# Patient Record
Sex: Female | Born: 1970 | Race: White | Hispanic: No | Marital: Married | Smoking: Never smoker
Health system: Southern US, Community
[De-identification: ages and names within clinical notes are randomized; demographics above are authoritative.]

## PROBLEM LIST (undated history)

## (undated) DIAGNOSIS — R011 Cardiac murmur, unspecified: Secondary | ICD-10-CM

## (undated) DIAGNOSIS — T7840XA Allergy, unspecified, initial encounter: Secondary | ICD-10-CM

## (undated) DIAGNOSIS — Z5189 Encounter for other specified aftercare: Secondary | ICD-10-CM

## (undated) HISTORY — DX: Encounter for other specified aftercare: Z51.89

## (undated) HISTORY — DX: Cardiac murmur, unspecified: R01.1

## (undated) HISTORY — DX: Allergy, unspecified, initial encounter: T78.40XA

---
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
## 1998-01-02 ENCOUNTER — Other Ambulatory Visit: Admission: RE | Admit: 1998-01-02 | Discharge: 1998-01-02 | Payer: Self-pay | Admitting: Obstetrics & Gynecology

## 1999-01-13 ENCOUNTER — Other Ambulatory Visit: Admission: RE | Admit: 1999-01-13 | Discharge: 1999-01-13 | Payer: Self-pay | Admitting: Obstetrics & Gynecology

## 2000-02-09 ENCOUNTER — Other Ambulatory Visit: Admission: RE | Admit: 2000-02-09 | Discharge: 2000-02-09 | Payer: Self-pay | Admitting: Obstetrics & Gynecology

## 2001-03-23 ENCOUNTER — Other Ambulatory Visit: Admission: RE | Admit: 2001-03-23 | Discharge: 2001-03-23 | Payer: Self-pay | Admitting: Obstetrics & Gynecology

## 2003-05-23 ENCOUNTER — Other Ambulatory Visit: Admission: RE | Admit: 2003-05-23 | Discharge: 2003-05-23 | Payer: Self-pay | Admitting: Obstetrics & Gynecology

## 2004-07-01 ENCOUNTER — Other Ambulatory Visit: Admission: RE | Admit: 2004-07-01 | Discharge: 2004-07-01 | Payer: Self-pay | Admitting: Obstetrics & Gynecology

## 2005-07-15 ENCOUNTER — Other Ambulatory Visit: Admission: RE | Admit: 2005-07-15 | Discharge: 2005-07-15 | Payer: Self-pay | Admitting: Obstetrics & Gynecology

## 2007-09-12 ENCOUNTER — Encounter: Admission: RE | Admit: 2007-09-12 | Discharge: 2007-09-12 | Payer: Self-pay | Admitting: Obstetrics & Gynecology

## 2011-03-30 ENCOUNTER — Ambulatory Visit: Payer: Self-pay | Admitting: Internal Medicine

## 2011-04-07 ENCOUNTER — Ambulatory Visit (INDEPENDENT_AMBULATORY_CARE_PROVIDER_SITE_OTHER): Payer: 59 | Admitting: Internal Medicine

## 2011-04-07 ENCOUNTER — Encounter: Payer: Self-pay | Admitting: Internal Medicine

## 2011-04-07 ENCOUNTER — Other Ambulatory Visit (INDEPENDENT_AMBULATORY_CARE_PROVIDER_SITE_OTHER): Payer: 59

## 2011-04-07 VITALS — BP 112/82 | HR 78 | Temp 98.5°F | Ht 66.0 in | Wt 153.8 lb

## 2011-04-07 DIAGNOSIS — Z0000 Encounter for general adult medical examination without abnormal findings: Secondary | ICD-10-CM

## 2011-04-07 DIAGNOSIS — Z23 Encounter for immunization: Secondary | ICD-10-CM

## 2011-04-07 LAB — URINALYSIS
Bilirubin Urine: NEGATIVE
Hgb urine dipstick: NEGATIVE
Ketones, ur: NEGATIVE
Leukocytes, UA: NEGATIVE
Total Protein, Urine: NEGATIVE
pH: 7.5 (ref 5.0–8.0)

## 2011-04-07 LAB — CBC WITH DIFFERENTIAL/PLATELET
Basophils Absolute: 0 10*3/uL (ref 0.0–0.1)
HCT: 40.2 % (ref 36.0–46.0)
Hemoglobin: 13.5 g/dL (ref 12.0–15.0)
Lymphs Abs: 1.1 10*3/uL (ref 0.7–4.0)
MCV: 91.8 fl (ref 78.0–100.0)
Monocytes Absolute: 0.3 10*3/uL (ref 0.1–1.0)
Monocytes Relative: 7.2 % (ref 3.0–12.0)
Neutro Abs: 2.9 10*3/uL (ref 1.4–7.7)
Platelets: 137 10*3/uL — ABNORMAL LOW (ref 150.0–400.0)
RDW: 12.9 % (ref 11.5–14.6)

## 2011-04-07 LAB — TSH: TSH: 1.84 u[IU]/mL (ref 0.35–5.50)

## 2011-04-07 LAB — LIPID PANEL
HDL: 56.6 mg/dL (ref >39.00)
LDL Cholesterol: 100 mg/dL — ABNORMAL HIGH (ref 0–99)
Total CHOL/HDL Ratio: 3
VLDL: 10.2 mg/dL (ref 0.0–40.0)

## 2011-04-07 LAB — BASIC METABOLIC PANEL
BUN: 10 mg/dL (ref 6–23)
CO2: 29 mEq/L (ref 19–32)
Chloride: 105 mEq/L (ref 96–112)
GFR: 95.78 mL/min (ref >60.00)
Glucose, Bld: 93 mg/dL (ref 70–99)
Potassium: 3.9 mEq/L (ref 3.5–5.1)
Sodium: 140 mEq/L (ref 135–145)

## 2011-04-07 LAB — HEPATIC FUNCTION PANEL
Bilirubin, Direct: 0.1 mg/dL (ref 0.0–0.3)
Total Bilirubin: 1 mg/dL (ref 0.3–1.2)

## 2011-04-07 NOTE — Patient Instructions (Signed)
It was good to see you today. We have reviewed your prior medical history today Exam looks great - no heart murmur, no other problems detected Test(s) ordered today. Your results will be called to you after review (48-72hours after test completion). If any changes need to be made, you will be notified at that time. Tdap booster given today Continue to follow with Dr. Jennette Kettle as ongoing Please schedule followup in 12 months for annual physical and labs, call sooner if problems.

## 2011-04-07 NOTE — Progress Notes (Signed)
  Subjective:    Patient ID: Linda Rodgers, female    DOB: 06-09-1971, 40 y.o.   MRN: [id number]  HPI New pt to me and our practice, here to establish care Also, patient is here today for annual physical. Patient feels well and has no complaints.  Past Medical History  Diagnosis Date  . Heart murmur     pregnancy   Family History  Problem Relation Age of Onset  . Breast cancer Other   . Hypertension Mother   . Diabetes Father   . Parkinsonism Father   . Heart disease Father 35    MI x 2   History  Substance Use Topics  . Smoking status: Never Smoker   . Smokeless tobacco: Not on file  . Alcohol Use: Yes    Review of Systems Constitutional: Negative for fever.  Respiratory: Negative for cough and shortness of breath.   Cardiovascular: Negative for chest pain.  Gastrointestinal: Negative for abdominal pain.  Musculoskeletal: Negative for gait problem.  Skin: Negative for rash.  Neurological: Negative for dizziness.  No other specific complaints in a complete review of systems    Objective:   Physical Exam BP 112/82  Pulse 78  Temp(Src) 98.5 F (36.9 C) (Oral)  Ht 5\' 6"  (1.676 m)  Wt 153 lb 12.8 oz (69.763 kg)  BMI 24.82 kg/m2  SpO2 98%  Constitutional: She is fit appearing; oriented to person, place, and time. She appears well-developed and well-nourished. No distress.  HENT: Head: Normocephalic and atraumatic. Ears; B TMs ok, no erythema or effusion; Nose: Nose normal.  Mouth/Throat: Oropharynx is clear and moist. No oropharyngeal exudate.  Eyes: Conjunctivae and EOM are normal. Pupils are equal, round, and reactive to light. No scleral icterus.  Neck: Normal range of motion. Neck supple. No JVD present. No thyromegaly present.  Cardiovascular: Normal rate, regular rhythm and normal heart sounds.  No murmur heard. No BLE edema. Pulmonary/Chest: Effort normal and breath sounds normal. No respiratory distress. She has no wheezes.  Abdominal: Soft. Bowel sounds  are normal. She exhibits no distension. There is no tenderness.  Musculoskeletal: Normal range of motion, no joint effusions. No gross deformities Neurological: She is alert and oriented to person, place, and time. No cranial nerve deficit. Coordination normal.  Skin: Skin is warm and dry. No rash noted. No erythema.  Psychiatric: She has a normal mood and affect. Her behavior is normal. Judgment and thought content normal.   No results found for this basename: WBC, HGB, HCT, PLT, CHOL, TRIG, HDL, LDLDIRECT, ALT, AST, NA, K, CL, CREATININE, BUN, CO2, TSH, PSA, INR, GLUF, HGBA1C, MICROALBUR       Assessment & Plan:   CPX - v70.0 - Patient has been counseled on age-appropriate routine health concerns for screening and prevention. These are reviewed and up-to-date. Immunizations are up-to-date or declined. Labs ordered and will be reviewed.

## 2011-07-29 ENCOUNTER — Encounter: Payer: Self-pay | Admitting: Internal Medicine

## 2011-07-29 ENCOUNTER — Other Ambulatory Visit: Payer: 59

## 2011-07-29 ENCOUNTER — Ambulatory Visit (INDEPENDENT_AMBULATORY_CARE_PROVIDER_SITE_OTHER): Payer: 59 | Admitting: Internal Medicine

## 2011-07-29 VITALS — BP 102/72 | HR 76 | Temp 98.3°F | Wt 152.8 lb

## 2011-07-29 DIAGNOSIS — Z111 Encounter for screening for respiratory tuberculosis: Secondary | ICD-10-CM

## 2011-07-29 DIAGNOSIS — Z1159 Encounter for screening for other viral diseases: Secondary | ICD-10-CM

## 2011-07-29 DIAGNOSIS — Z0289 Encounter for other administrative examinations: Secondary | ICD-10-CM

## 2011-07-29 DIAGNOSIS — Z23 Encounter for immunization: Secondary | ICD-10-CM

## 2011-07-29 LAB — RUBELLA SCREEN: Rubella: 75.9 IU/mL — ABNORMAL HIGH

## 2011-07-29 NOTE — Patient Instructions (Signed)
It was good to see you today. You received your flu shot today and begun the series of injections for hepatitis B  - 1st of 3 done today Blood drawn today to prove immunity against measles mumps rubella (MMR) PPD placed today for tuberculosis screening.  You'll need to return on Friday to this office for nurse check on this PPD ; we will then return of your paperwork to you for your employment

## 2011-07-29 NOTE — Progress Notes (Signed)
  Subjective:    Patient ID: Linda Rodgers, female    DOB: August 18, 1971, 40 y.o.   MRN: [id number]  HPI  Here for paperwork completion> [facility] [employer] public school form verifying TB check and immunization review -patient feels well today  Past Medical History  Diagnosis Date  . Heart murmur     pregnancy     Review of Systems  Constitutional: Negative for fever and unexpected weight change.  Respiratory: Negative for cough and shortness of breath.   Cardiovascular: Negative for chest pain and leg swelling.  Neurological: Negative for dizziness and headaches.       Objective:   Physical Exam BP 102/72  Pulse 76  Temp(Src) 98.3 F (36.8 C) (Oral)  Wt 152 lb 12.8 oz (69.31 kg)  SpO2 99% Wt Readings from Last 3 Encounters:  07/29/11 152 lb 12.8 oz (69.31 kg)  04/07/11 153 lb 12.8 oz (69.763 kg)   Constitutional: She appears well-developed and well-nourished. No distress.  Neck: Normal range of motion. Neck supple. No JVD present. No thyromegaly present.  Cardiovascular: Normal rate, regular rhythm and normal heart sounds.  No murmur heard. No BLE edema. Pulmonary/Chest: Effort normal and breath sounds normal. No respiratory distress. She has no wheezes.    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

## 2011-07-31 LAB — TB SKIN TEST: TB Skin Test: NEGATIVE mm

## 2011-08-28 ENCOUNTER — Ambulatory Visit (INDEPENDENT_AMBULATORY_CARE_PROVIDER_SITE_OTHER): Payer: BC Managed Care – PPO | Admitting: *Deleted

## 2011-08-28 ENCOUNTER — Ambulatory Visit: Payer: BC Managed Care – PPO | Admitting: Internal Medicine

## 2011-08-28 DIAGNOSIS — Z23 Encounter for immunization: Secondary | ICD-10-CM

## 2011-09-07 LAB — HM MAMMOGRAPHY: HM Mammogram: NEGATIVE

## 2011-09-18 ENCOUNTER — Telehealth: Payer: Self-pay | Admitting: Internal Medicine

## 2011-09-18 NOTE — Telephone Encounter (Signed)
Received copies from [facility] ,on 1.11.13 . Forwarded 1  pages to Dr. Felicity Coyer ,for review.  sj

## 2011-09-21 ENCOUNTER — Encounter: Payer: Self-pay | Admitting: Internal Medicine

## 2012-01-26 ENCOUNTER — Ambulatory Visit (INDEPENDENT_AMBULATORY_CARE_PROVIDER_SITE_OTHER): Payer: BC Managed Care – PPO | Admitting: *Deleted

## 2012-01-26 DIAGNOSIS — Z23 Encounter for immunization: Secondary | ICD-10-CM

## 2012-09-19 ENCOUNTER — Encounter: Payer: BC Managed Care – PPO | Admitting: Internal Medicine

## 2012-10-17 ENCOUNTER — Ambulatory Visit (INDEPENDENT_AMBULATORY_CARE_PROVIDER_SITE_OTHER): Payer: BC Managed Care – PPO | Admitting: Internal Medicine

## 2012-10-17 ENCOUNTER — Other Ambulatory Visit (INDEPENDENT_AMBULATORY_CARE_PROVIDER_SITE_OTHER): Payer: BC Managed Care – PPO

## 2012-10-17 ENCOUNTER — Encounter: Payer: Self-pay | Admitting: Internal Medicine

## 2012-10-17 VITALS — BP 100/80 | HR 83 | Temp 98.0°F | Ht 66.0 in | Wt 150.1 lb

## 2012-10-17 DIAGNOSIS — Z0000 Encounter for general adult medical examination without abnormal findings: Secondary | ICD-10-CM

## 2012-10-17 LAB — BASIC METABOLIC PANEL
BUN: 9 mg/dL (ref 6–23)
Calcium: 9.5 mg/dL (ref 8.4–10.5)
Creatinine, Ser: 0.7 mg/dL (ref 0.4–1.2)
GFR: 96.58 mL/min (ref >60.00)
Glucose, Bld: 95 mg/dL (ref 70–99)

## 2012-10-17 LAB — HM MAMMOGRAPHY

## 2012-10-17 LAB — LIPID PANEL
Cholesterol: 166 mg/dL (ref 0–200)
HDL: 54.7 mg/dL (ref >39.00)
LDL Cholesterol: 101 mg/dL — ABNORMAL HIGH (ref 0–99)
Triglycerides: 50 mg/dL (ref 0.0–149.0)
VLDL: 10 mg/dL (ref 0.0–40.0)

## 2012-10-17 LAB — TSH: TSH: 2.43 u[IU]/mL (ref 0.35–5.50)

## 2012-10-17 LAB — URINALYSIS, ROUTINE W REFLEX MICROSCOPIC
Bilirubin Urine: NEGATIVE
Leukocytes, UA: NEGATIVE
Nitrite: NEGATIVE
pH: 6.5 (ref 5.0–8.0)

## 2012-10-17 LAB — CBC WITH DIFFERENTIAL/PLATELET
Basophils Absolute: 0 10*3/uL (ref 0.0–0.1)
HCT: 42.7 % (ref 36.0–46.0)
Lymphocytes Relative: 20.5 % (ref 12.0–46.0)
Lymphs Abs: 1.1 10*3/uL (ref 0.7–4.0)
Monocytes Relative: 7.5 % (ref 3.0–12.0)
Neutrophils Relative %: 70.8 % (ref 43.0–77.0)
Platelets: 153 10*3/uL (ref 150.0–400.0)
RDW: 12.5 % (ref 11.5–14.6)

## 2012-10-17 LAB — HEPATIC FUNCTION PANEL
Albumin: 4.4 g/dL (ref 3.5–5.2)
Total Bilirubin: 0.9 mg/dL (ref 0.3–1.2)

## 2012-10-17 NOTE — Patient Instructions (Signed)
It was good to see you today. Health Maintenance reviewed - all recommended immunizations and age-appropriate screenings are up-to-date. Test(s) ordered today. Your results will be released to MyChart (or called to you) after review, usually within 72hours after test completion. If any changes need to be made, you will be notified at that same time. Please schedule followup in 1-2 year for medical physical and labs, call sooner if problems.  Health Maintenance, Females A healthy lifestyle and preventative care can promote health and wellness.  Maintain regular health, dental, and eye exams.  Eat a healthy diet. Foods like vegetables, fruits, whole grains, low-fat dairy products, and lean protein foods contain the nutrients you need without too many calories. Decrease your intake of foods high in solid fats, added sugars, and salt. Get information about a proper diet from your caregiver, if necessary.  Regular physical exercise is one of the most important things you can do for your health. Most adults should get at least 150 minutes of moderate-intensity exercise (any activity that increases your heart rate and causes you to sweat) each week. In addition, most adults need muscle-strengthening exercises on 2 or more days a week.   Maintain a healthy weight. The body mass index (BMI) is a screening tool to identify possible weight problems. It provides an estimate of body fat based on height and weight. Your caregiver can help determine your BMI, and can help you achieve or maintain a healthy weight. For adults 20 years and older:  A BMI below 18.5 is considered underweight.  A BMI of 18.5 to 24.9 is normal.  A BMI of 25 to 29.9 is considered overweight.  A BMI of 30 and above is considered obese.  Maintain normal blood lipids and cholesterol by exercising and minimizing your intake of saturated fat. Eat a balanced diet with plenty of fruits and vegetables. Blood tests for lipids and  cholesterol should begin at age 37 and be repeated every 5 years. If your lipid or cholesterol levels are high, you are over 50, or you are a high risk for heart disease, you may need your cholesterol levels checked more frequently.Ongoing high lipid and cholesterol levels should be treated with medicines if diet and exercise are not effective.  If you smoke, find out from your caregiver how to quit. If you do not use tobacco, do not start.  If you are pregnant, do not drink alcohol. If you are breastfeeding, be very cautious about drinking alcohol. If you are not pregnant and choose to drink alcohol, do not exceed 1 drink per day. One drink is considered to be 12 ounces (355 mL) of beer, 5 ounces (148 mL) of wine, or 1.5 ounces (44 mL) of liquor.  Avoid use of street drugs. Do not share needles with anyone. Ask for help if you need support or instructions about stopping the use of drugs.  High blood pressure causes heart disease and increases the risk of stroke. Blood pressure should be checked at least every 1 to 2 years. Ongoing high blood pressure should be treated with medicines, if weight loss and exercise are not effective.  If you are 21 to 41 years old, ask your caregiver if you should take aspirin to prevent strokes.  Diabetes screening involves taking a blood sample to check your fasting blood sugar level. This should be done once every 3 years, after age 4, if you are within normal weight and without risk factors for diabetes. Testing should be considered at a younger  age or be carried out more frequently if you are overweight and have at least 1 risk factor for diabetes.  Breast cancer screening is essential preventative care for women. You should practice "breast self-awareness." This means understanding the normal appearance and feel of your breasts and may include breast self-examination. Any changes detected, no matter how small, should be reported to a caregiver. Women in their 44s  and 30s should have a clinical breast exam (CBE) by a caregiver as part of a regular health exam every 1 to 3 years. After age 80, women should have a CBE every year. Starting at age 46, women should consider having a mammogram (breast X-ray) every year. Women who have a family history of breast cancer should talk to their caregiver about genetic screening. Women at a high risk of breast cancer should talk to their caregiver about having an MRI and a mammogram every year.  The Pap test is a screening test for cervical cancer. Women should have a Pap test starting at age 29. Between ages 2 and 79, Pap tests should be repeated every 2 years. Beginning at age 81, you should have a Pap test every 3 years as long as the past 3 Pap tests have been normal. If you had a hysterectomy for a problem that was not cancer or a condition that could lead to cancer, then you no longer need Pap tests. If you are between ages 11 and 26, and you have had normal Pap tests going back 10 years, you no longer need Pap tests. If you have had past treatment for cervical cancer or a condition that could lead to cancer, you need Pap tests and screening for cancer for at least 20 years after your treatment. If Pap tests have been discontinued, risk factors (such as a new sexual partner) need to be reassessed to determine if screening should be resumed. Some women have medical problems that increase the chance of getting cervical cancer. In these cases, your caregiver may recommend more frequent screening and Pap tests.  The human papillomavirus (HPV) test is an additional test that may be used for cervical cancer screening. The HPV test looks for the virus that can cause the cell changes on the cervix. The cells collected during the Pap test can be tested for HPV. The HPV test could be used to screen women aged 68 years and older, and should be used in women of any age who have unclear Pap test results. After the age of 50, women should  have HPV testing at the same frequency as a Pap test.  Colorectal cancer can be detected and often prevented. Most routine colorectal cancer screening begins at the age of 81 and continues through age 67. However, your caregiver may recommend screening at an earlier age if you have risk factors for colon cancer. On a yearly basis, your caregiver may provide home test kits to check for hidden blood in the stool. Use of a small camera at the end of a tube, to directly examine the colon (sigmoidoscopy or colonoscopy), can detect the earliest forms of colorectal cancer. Talk to your caregiver about this at age 69, when routine screening begins. Direct examination of the colon should be repeated every 5 to 10 years through age 68, unless early forms of pre-cancerous polyps or small growths are found.  Hepatitis C blood testing is recommended for all people born from [birth year] through 1964 and any individual with known risks for hepatitis C.  Practice safe  sex. Use condoms and avoid high-risk sexual practices to reduce the spread of sexually transmitted infections (STIs). Sexually active women aged 18 and younger should be checked for Chlamydia, which is a common sexually transmitted infection. Older women with new or multiple partners should also be tested for Chlamydia. Testing for other STIs is recommended if you are sexually active and at increased risk.  Osteoporosis is a disease in which the bones lose minerals and strength with aging. This can result in serious bone fractures. The risk of osteoporosis can be identified using a bone density scan. Women ages 80 and over and women at risk for fractures or osteoporosis should discuss screening with their caregivers. Ask your caregiver whether you should be taking a calcium supplement or vitamin D to reduce the rate of osteoporosis.  Menopause can be associated with physical symptoms and risks. Hormone replacement therapy is available to decrease symptoms and  risks. You should talk to your caregiver about whether hormone replacement therapy is right for you.  Use sunscreen with a sun protection factor (SPF) of 30 or greater. Apply sunscreen liberally and repeatedly throughout the day. You should seek shade when your shadow is shorter than you. Protect yourself by wearing long sleeves, pants, a wide-brimmed hat, and sunglasses year round, whenever you are outdoors.  Notify your caregiver of new moles or changes in moles, especially if there is a change in shape or color. Also notify your caregiver if a mole is larger than the size of a pencil eraser.  Stay current with your immunizations. Document Released: 03/09/2011 Document Revised: 11/16/2011 Document Reviewed: 03/09/2011 [facility] Patient Information 2013 [place], [place].

## 2012-10-17 NOTE — Progress Notes (Signed)
  Subjective:    Patient ID: Linda Rodgers, female    DOB: 12-19-1970, 42 y.o.   MRN: [id number]  HPI  patient is here today for annual physical. Patient feels well and has no complaints.  Past Medical History  Diagnosis Date  . Heart murmur     pregnancy   Family History  Problem Relation Age of Onset  . Breast cancer Other   . Hypertension Mother   . Diabetes Father   . Parkinsonism Father   . Heart disease Father 65    MI x 2   History  Substance Use Topics  . Smoking status: Never Smoker   . Smokeless tobacco: Not on file  . Alcohol Use: Yes    Review of Systems  Constitutional: Negative for fever.  Respiratory: Negative for cough and shortness of breath.   Cardiovascular: Negative for chest pain or palpitations.  Gastrointestinal: Negative for abdominal pain or bowel changes.  Musculoskeletal: Negative for gait problem or joint swelling.  Skin: Negative for rash.  Neurological: Negative for dizziness.  No other specific complaints in a complete review of systems    Objective:   Physical Exam  BP 100/80  Pulse 83  Temp(Src) 98 F (36.7 C) (Oral)  Ht 5\' 6"  (1.676 m)  Wt 150 lb 1.9 oz (68.094 kg)  BMI 24.24 kg/m2  SpO2 98% Wt Readings from Last 3 Encounters:  10/17/12 150 lb 1.9 oz (68.094 kg)  07/29/11 152 lb 12.8 oz (69.31 kg)  04/07/11 153 lb 12.8 oz (69.763 kg)   Constitutional: She is fit appearing, well-developed and well-nourished. No distress.  HENT: Head: Normocephalic and atraumatic. Ears; B TMs ok, no erythema or effusion; Nose: Nose normal. Mouth/Throat: Oropharynx is clear and moist. No oropharyngeal exudate.  Eyes: Conjunctivae and EOM are normal. Pupils are equal, round, and reactive to light. No scleral icterus.  Neck: Normal range of motion. Neck supple. No JVD present. No thyromegaly present.  Cardiovascular: Normal rate, regular rhythm and normal heart sounds.  No murmur heard. No BLE edema. Pulmonary/Chest: Effort normal and breath  sounds normal. No respiratory distress. She has no wheezes.  Abdominal: Soft. Bowel sounds are normal. She exhibits no distension. There is no tenderness.  Musculoskeletal: Normal range of motion, no joint effusions. No gross deformities Neurological: She is alert and oriented to person, place, and time. No cranial nerve deficit. Coordination normal.  Skin: Skin is warm and dry. No rash noted. No erythema.  Psychiatric: She has a normal mood and affect. Her behavior is normal. Judgment and thought content normal.   Lab Results  Component Value Date   WBC 4.4* 04/07/2011   HGB 13.5 04/07/2011   HCT 40.2 04/07/2011   PLT 137.0* 04/07/2011   GLUCOSE 93 04/07/2011   CHOL 167 04/07/2011   TRIG 51.0 04/07/2011   HDL 56.60 04/07/2011   LDLCALC 100* 04/07/2011   ALT 17 04/07/2011   AST 13 04/07/2011   NA 140 04/07/2011   K 3.9 04/07/2011   CL 105 04/07/2011   CREATININE 0.7 04/07/2011   BUN 10 04/07/2011   CO2 29 04/07/2011   TSH 1.84 04/07/2011   ECG: sinus @ 80-bpm; PR .110 - no prior ECG    Assessment & Plan:   CPX - v70.0 - Patient has been counseled on age-appropriate routine health concerns for screening and prevention. These are reviewed and up-to-date. Immunizations are up-to-date or declined. ECG reviewed; Labs ordered and will be reviewed.

## 2013-03-07 LAB — HM PAP SMEAR

## 2013-07-13 ENCOUNTER — Other Ambulatory Visit: Payer: Self-pay

## 2013-10-18 ENCOUNTER — Encounter: Payer: BC Managed Care – PPO | Admitting: Internal Medicine

## 2013-10-19 ENCOUNTER — Encounter: Payer: Self-pay | Admitting: Internal Medicine

## 2013-10-19 ENCOUNTER — Other Ambulatory Visit (INDEPENDENT_AMBULATORY_CARE_PROVIDER_SITE_OTHER): Payer: BC Managed Care – PPO

## 2013-10-19 ENCOUNTER — Ambulatory Visit (INDEPENDENT_AMBULATORY_CARE_PROVIDER_SITE_OTHER): Payer: BC Managed Care – PPO | Admitting: Internal Medicine

## 2013-10-19 VITALS — BP 102/70 | HR 70 | Temp 98.3°F | Ht 66.0 in | Wt 151.1 lb

## 2013-10-19 DIAGNOSIS — Z0000 Encounter for general adult medical examination without abnormal findings: Secondary | ICD-10-CM

## 2013-10-19 LAB — URINALYSIS, ROUTINE W REFLEX MICROSCOPIC
Bilirubin Urine: NEGATIVE
Hgb urine dipstick: NEGATIVE
Ketones, ur: NEGATIVE
Leukocytes, UA: NEGATIVE
NITRITE: NEGATIVE
RBC / HPF: NONE SEEN (ref 0–?)
Specific Gravity, Urine: <=1.005 — AB (ref 1.000–1.030)
TOTAL PROTEIN, URINE-UPE24: NEGATIVE
URINE GLUCOSE: NEGATIVE
Urobilinogen, UA: 0.2 (ref 0.0–1.0)
WBC UA: NONE SEEN (ref 0–?)
pH: 7.5 (ref 5.0–8.0)

## 2013-10-19 LAB — CBC WITH DIFFERENTIAL/PLATELET
BASOS PCT: 0.2 % (ref 0.0–3.0)
Basophils Absolute: 0 10*3/uL (ref 0.0–0.1)
EOS PCT: 1.5 % (ref 0.0–5.0)
Eosinophils Absolute: 0.1 10*3/uL (ref 0.0–0.7)
HEMATOCRIT: 44.8 % (ref 36.0–46.0)
HEMOGLOBIN: 15.1 g/dL — AB (ref 12.0–15.0)
LYMPHS ABS: 1 10*3/uL (ref 0.7–4.0)
Lymphocytes Relative: 24.4 % (ref 12.0–46.0)
MCHC: 33.6 g/dL (ref 30.0–36.0)
MCV: 91.4 fl (ref 78.0–100.0)
MONO ABS: 0.3 10*3/uL (ref 0.1–1.0)
MONOS PCT: 7 % (ref 3.0–12.0)
NEUTROS ABS: 2.7 10*3/uL (ref 1.4–7.7)
Neutrophils Relative %: 66.9 % (ref 43.0–77.0)
PLATELETS: 173 10*3/uL (ref 150.0–400.0)
RBC: 4.91 Mil/uL (ref 3.87–5.11)
RDW: 12.8 % (ref 11.5–14.6)
WBC: 4 10*3/uL — AB (ref 4.5–10.5)

## 2013-10-19 LAB — HEPATIC FUNCTION PANEL
ALT: 22 U/L (ref 0–35)
AST: 15 U/L (ref 0–37)
Albumin: 4.5 g/dL (ref 3.5–5.2)
Alkaline Phosphatase: 49 U/L (ref 39–117)
Bilirubin, Direct: 0.1 mg/dL (ref 0.0–0.3)
Total Bilirubin: 1.2 mg/dL (ref 0.3–1.2)
Total Protein: 7.4 g/dL (ref 6.0–8.3)

## 2013-10-19 LAB — BASIC METABOLIC PANEL
BUN: 7 mg/dL (ref 6–23)
CHLORIDE: 104 meq/L (ref 96–112)
CO2: 31 mEq/L (ref 19–32)
Calcium: 9.7 mg/dL (ref 8.4–10.5)
Creatinine, Ser: 0.8 mg/dL (ref 0.4–1.2)
GFR: 87.51 mL/min (ref >60.00)
Glucose, Bld: 93 mg/dL (ref 70–99)
POTASSIUM: 4.3 meq/L (ref 3.5–5.1)
SODIUM: 140 meq/L (ref 135–145)

## 2013-10-19 LAB — LIPID PANEL
CHOL/HDL RATIO: 3
CHOLESTEROL: 182 mg/dL (ref 0–200)
HDL: 59.8 mg/dL (ref >39.00)
LDL Cholesterol: 115 mg/dL — ABNORMAL HIGH (ref 0–99)
TRIGLYCERIDES: 38 mg/dL (ref 0.0–149.0)
VLDL: 7.6 mg/dL (ref 0.0–40.0)

## 2013-10-19 LAB — TSH: TSH: 1.26 u[IU]/mL (ref 0.35–5.50)

## 2013-10-19 NOTE — Progress Notes (Signed)
Pre-visit discussion using our clinic review tool. No additional management support is needed unless otherwise documented below in the visit note.  

## 2013-10-19 NOTE — Progress Notes (Signed)
    Subjective:    Patient ID: Linda Rodgers, female    DOB: [birth year], 43 y.o.   MRN: [id number]  HPI patient is here today for annual physical. Patient feels well and has no complaints.  Past Medical History  Diagnosis Date  . Heart murmur     pregnancy   Family History  Problem Relation Age of Onset  . Breast cancer Other   . Hypertension Mother   . Diabetes Father   . Parkinsonism Father   . Heart disease Father [id number]    MI x 2   History  Substance Use Topics  . Smoking status: Never Smoker   . Smokeless tobacco: Not on file  . Alcohol Use: Yes    Review of Systems  Constitutional: Negative for fatigue and unexpected weight change.  Respiratory: Negative for cough, shortness of breath and wheezing.   Cardiovascular: Negative for chest pain, palpitations and leg swelling.  Gastrointestinal: Negative for nausea, abdominal pain and diarrhea.  Neurological: Negative for dizziness, weakness, light-headedness and headaches.  Psychiatric/Behavioral: Negative for dysphoric mood. The patient is not nervous/anxious.   All other systems reviewed and are negative.       Objective:   Physical Exam BP 102/70  Pulse 70  Temp(Src) 98.3 F (36.8 C) (Oral)  Ht 5\' 6"  (1.676 m)  Wt 151 lb 1.9 oz (68.548 kg)  BMI 24.40 kg/m2  SpO2 97% Wt Readings from Last 3 Encounters:  10/19/13 151 lb 1.9 oz (68.548 kg)  10/17/12 150 lb 1.9 oz (68.094 kg)  07/29/11 152 lb 12.8 oz (69.31 kg)   Constitutional: She is fit appearing, well-developed and well-nourished. No distress.  HENT: Head: Normocephalic and atraumatic. Ears; B TMs ok, no erythema or effusion; Nose: Nose normal. Mouth/Throat: Oropharynx is clear and moist. No oropharyngeal exudate.  Eyes: Conjunctivae and EOM are normal. Pupils are equal, round, and reactive to light. No scleral icterus.  Neck: Normal range of motion. Neck supple. No JVD present. No thyromegaly present.  Cardiovascular: Normal rate, regular rhythm and  normal heart sounds.  No murmur heard. No BLE edema. Pulmonary/Chest: Effort normal and breath sounds normal. No respiratory distress. She has no wheezes.  Abdominal: Soft. Bowel sounds are normal. She exhibits no distension. There is no tenderness.  Musculoskeletal: Normal range of motion, no joint effusions. No gross deformities Neurological: She is alert and oriented to person, place, and time. No cranial nerve deficit. Coordination normal.  Skin: Skin is warm and dry. No rash noted. No erythema.  Psychiatric: She has a normal mood and affect. Her behavior is normal. Judgment and thought content normal.   Lab Results  Component Value Date   WBC 5.5 10/17/2012   HGB 14.6 10/17/2012   HCT 42.7 10/17/2012   PLT 153.0 10/17/2012   GLUCOSE 95 10/17/2012   CHOL 166 10/17/2012   TRIG 50.0 10/17/2012   HDL 54.70 10/17/2012   LDLCALC 101* 10/17/2012   ALT 16 10/17/2012   AST 15 10/17/2012   NA 139 10/17/2012   K 4.1 10/17/2012   CL 104 10/17/2012   CREATININE 0.7 10/17/2012   BUN 9 10/17/2012   CO2 29 10/17/2012   TSH 2.43 10/17/2012       Assessment & Plan:   CPX /v70.0 - Patient has been counseled on age-appropriate routine health concerns for screening and prevention. These are reviewed and up-to-date. Immunizations are up-to-date or declined. last ECG reviewed; Labs ordered and will be reviewed.

## 2013-10-19 NOTE — Progress Notes (Signed)
Linda Rodgers [id number] 10/19/2013   Chief Complaint  Patient presents with  . Annual Exam    Subjective  HPI Patient is here for her annual physical.  No new complaints.  Past Medical History  Diagnosis Date  . Heart murmur     pregnancy    Past Surgical History  Procedure Laterality Date  . Wisdom tooth extraction  1987    Family History  Problem Relation Age of Onset  . Breast cancer Other   . Hypertension Mother   . Diabetes Father   . Parkinsonism Father   . Heart disease Father 26    MI x 2    History  Substance Use Topics  . Smoking status: Never Smoker   . Smokeless tobacco: Not on file  . Alcohol Use: Yes    No current outpatient prescriptions on file prior to visit.   No current facility-administered medications on file prior to visit.    Allergies:  Allergies  Allergen Reactions  . Neomycin     Per pt neomycin family  . Neosporin [Neomycin-Polymyxin B Gu]     Review of Systems  Constitutional: Negative for fever, chills, weight loss and malaise/fatigue.  HENT: Negative for congestion and nosebleeds.   Eyes: Negative for blurred vision.  Respiratory: Negative for cough, shortness of breath and wheezing.   Cardiovascular: Negative for chest pain.  Gastrointestinal: Negative for heartburn, nausea and vomiting.  Neurological: Negative for speech change and headaches.  Psychiatric/Behavioral: Negative for depression and suicidal ideas. The patient is not nervous/anxious and does not have insomnia.        Objective  Filed Vitals:   10/19/13 0840  BP: 102/70  Pulse: 70  Temp: 98.3 F (36.8 C)  TempSrc: Oral  Height: 5\' 6"  (1.676 m)  Weight: 151 lb 1.9 oz (68.548 kg)  SpO2: 97%    Physical Exam  Nursing note and vitals reviewed. Constitutional: She is oriented to person, place, and time. She appears well-developed and well-nourished. No distress.  HENT:  Head: Normocephalic and atraumatic.  Right Ear: External ear normal.   Left Ear: External ear normal.  Eyes: Conjunctivae are normal. Pupils are equal, round, and reactive to light. Right eye exhibits no discharge. Left eye exhibits no discharge.  Neck: No JVD present.  Cardiovascular: Normal rate, regular rhythm, normal heart sounds and intact distal pulses.  Exam reveals no gallop and no friction rub.   No murmur heard. Respiratory: Effort normal and breath sounds normal. No stridor. No respiratory distress. She has no wheezes. She has no rales. She exhibits no tenderness.  GI: Soft. Bowel sounds are normal. There is no tenderness.  Musculoskeletal: Normal range of motion. She exhibits no tenderness.  Neurological: She is alert and oriented to person, place, and time.  Skin: She is not diaphoretic.  Psychiatric: She has a normal mood and affect. Her speech is normal and behavior is normal. Judgment and thought content normal. Cognition and memory are normal.    BP Readings from Last 3 Encounters:  10/19/13 102/70  10/17/12 100/80  07/29/11 102/72    Wt Readings from Last 3 Encounters:  10/19/13 151 lb 1.9 oz (68.548 kg)  10/17/12 150 lb 1.9 oz (68.094 kg)  07/29/11 152 lb 12.8 oz (69.31 kg)    Lab Results  Component Value Date   WBC 5.5 10/17/2012   HGB 14.6 10/17/2012   HCT 42.7 10/17/2012   PLT 153.0 10/17/2012   GLUCOSE 95 10/17/2012   CHOL 166 10/17/2012  TRIG 50.0 10/17/2012   HDL 54.70 10/17/2012   LDLCALC 101* 10/17/2012   ALT 16 10/17/2012   AST 15 10/17/2012   NA 139 10/17/2012   K 4.1 10/17/2012   CL 104 10/17/2012   CREATININE 0.7 10/17/2012   BUN 9 10/17/2012   CO2 29 10/17/2012   TSH 2.43 10/17/2012    Mm Digital Diagnostic Unilat L  09/12/2007   DG DIAGNOSTIC UNILATERAL L CC and MLO view(s) were taken of the left breast.   DIGITAL UNILATERAL LEFT DIAGNOSTIC MAMMOGRAM:   CLINICAL DATA:  The patient returns for evaluation of calcifications in the left breast noted on  recent screening study from [facility] dated 08-19-07.    Magnification views of the left breast demonstrate calcifications that are consistent with benign  milk of calcium.  No suspicious linear forms are noted.   IMPRESSION:   Benign milk of calcium in the left breast.  Yearly screening should begin at age 42 unless  clinically indicated earlier.   ASSESSMENT: Benign - BI-RADS 2   Screening mammogram of both breasts at age 43. ,  Provider: Evaristo BuryStephanie Pugh    Assessment and Plan  V70.0 reviewed past labs. Patient reports a healthy lifestyle with daily exercise and a healthy diet.  Patient does report any depression symptoms.  No new family changes or complaints.  Patient is up to date on vaccines.  She has an appointment Monday with her OBGYN for Mammo and Pap.  Will order baseline labs today, CBC. BMET, LFT, TSH, Urinalysis.   Patient was instructed to notify the office if any new symptoms emerged or the current symptoms become worse.   Return in about 1 year (around 10/19/2014) for annual exam and labs. Rene PaciValerie Leschber, MD    I have personally reviewed this case with PA student. I also personally examined this patient. I agree with history and findings as documented above. I reviewed, discussed and approve of the assessment and plan as listed above. Rene PaciValerie Leschber, MD

## 2013-10-19 NOTE — Patient Instructions (Addendum)
It was good to see you today.  We have reviewed your prior records including labs and tests today  Health Maintenance reviewed - all recommended immunizations and age-appropriate screenings are up-to-date.  Test(s) ordered today. Your results will be released to [facility] (or called to you) after review, usually within 72hours after test completion. If any changes need to be made, you will be notified at that same time.  Medications reviewed and updated, no changes recommended at this time.  Please schedule followup in 12 months for annual exam and labs, call sooner if problems.  Health Maintenance, Female A healthy lifestyle and preventative care can promote health and wellness.  Maintain regular health, dental, and eye exams.  Eat a healthy diet. Foods like vegetables, fruits, whole grains, low-fat dairy products, and lean protein foods contain the nutrients you need without too many calories. Decrease your intake of foods high in solid fats, added sugars, and salt. Get information about a proper diet from your caregiver, if necessary.  Regular physical exercise is one of the most important things you can do for your health. Most adults should get at least 150 minutes of moderate-intensity exercise (any activity that increases your heart rate and causes you to sweat) each week. In addition, most adults need muscle-strengthening exercises on 2 or more days a week.   Maintain a healthy weight. The body mass index (BMI) is a screening tool to identify possible weight problems. It provides an estimate of body fat based on height and weight. Your caregiver can help determine your BMI, and can help you achieve or maintain a healthy weight. For adults 20 years and older:  A BMI below 18.5 is considered underweight.  A BMI of 18.5 to 24.9 is normal.  A BMI of 25 to 29.9 is considered overweight.  A BMI of 30 and above is considered obese.  Maintain normal blood lipids and cholesterol by  exercising and minimizing your intake of saturated fat. Eat a balanced diet with plenty of fruits and vegetables. Blood tests for lipids and cholesterol should begin at age 96 and be repeated every 5 years. If your lipid or cholesterol levels are high, you are over 50, or you are a high risk for heart disease, you may need your cholesterol levels checked more frequently.Ongoing high lipid and cholesterol levels should be treated with medicines if diet and exercise are not effective.  If you smoke, find out from your caregiver how to quit. If you do not use tobacco, do not start.  Lung cancer screening is recommended for adults aged 32 80 years who are at high risk for developing lung cancer because of a history of smoking. Yearly low-dose computed tomography (CT) is recommended for people who have at least a 30-pack-year history of smoking and are a current smoker or have quit within the past 15 years. A pack year of smoking is smoking an average of 1 pack of cigarettes a day for 1 year (for example: 1 pack a day for 30 years or 2 packs a day for 15 years). Yearly screening should continue until the smoker has stopped smoking for at least 15 years. Yearly screening should also be stopped for people who develop a health problem that would prevent them from having lung cancer treatment.  If you are pregnant, do not drink alcohol. If you are breastfeeding, be very cautious about drinking alcohol. If you are not pregnant and choose to drink alcohol, do not exceed 1 drink per day. One drink  is considered to be 12 ounces (355 mL) of beer, 5 ounces (148 mL) of wine, or 1.5 ounces (44 mL) of liquor.  Avoid use of street drugs. Do not share needles with anyone. Ask for help if you need support or instructions about stopping the use of drugs.  High blood pressure causes heart disease and increases the risk of stroke. Blood pressure should be checked at least every 1 to 2 years. Ongoing high blood pressure should be  treated with medicines, if weight loss and exercise are not effective.  If you are 39 to 42 years old, ask your caregiver if you should take aspirin to prevent strokes.  Diabetes screening involves taking a blood sample to check your fasting blood sugar level. This should be done once every 3 years, after age 66, if you are within normal weight and without risk factors for diabetes. Testing should be considered at a younger age or be carried out more frequently if you are overweight and have at least 1 risk factor for diabetes.  Breast cancer screening is essential preventative care for women. You should practice "breast self-awareness." This means understanding the normal appearance and feel of your breasts and may include breast self-examination. Any changes detected, no matter how small, should be reported to a caregiver. Women in their 73s and 30s should have a clinical breast exam (CBE) by a caregiver as part of a regular health exam every 1 to 3 years. After age 39, women should have a CBE every year. Starting at age 91, women should consider having a mammogram (breast X-ray) every year. Women who have a family history of breast cancer should talk to their caregiver about genetic screening. Women at a high risk of breast cancer should talk to their caregiver about having an MRI and a mammogram every year.  Breast cancer gene (BRCA)-related cancer risk assessment is recommended for women who have family members with BRCA-related cancers. BRCA-related cancers include breast, ovarian, tubal, and peritoneal cancers. Having family members with these cancers may be associated with an increased risk for harmful changes (mutations) in the breast cancer genes BRCA1 and BRCA2. Results of the assessment will determine the need for genetic counseling and BRCA1 and BRCA2 testing.  The Pap test is a screening test for cervical cancer. Women should have a Pap test starting at age 63. Between ages 85 and 56, Pap  tests should be repeated every 2 years. Beginning at age 29, you should have a Pap test every 3 years as long as the past 3 Pap tests have been normal. If you had a hysterectomy for a problem that was not cancer or a condition that could lead to cancer, then you no longer need Pap tests. If you are between ages 49 and 47, and you have had normal Pap tests going back 10 years, you no longer need Pap tests. If you have had past treatment for cervical cancer or a condition that could lead to cancer, you need Pap tests and screening for cancer for at least 20 years after your treatment. If Pap tests have been discontinued, risk factors (such as a new sexual partner) need to be reassessed to determine if screening should be resumed. Some women have medical problems that increase the chance of getting cervical cancer. In these cases, your caregiver may recommend more frequent screening and Pap tests.  The human papillomavirus (HPV) test is an additional test that may be used for cervical cancer screening. The HPV test looks for  the virus that can cause the cell changes on the cervix. The cells collected during the Pap test can be tested for HPV. The HPV test could be used to screen women aged 31 years and older, and should be used in women of any age who have unclear Pap test results. After the age of 35, women should have HPV testing at the same frequency as a Pap test.  Colorectal cancer can be detected and often prevented. Most routine colorectal cancer screening begins at the age of 76 and continues through age 69. However, your caregiver may recommend screening at an earlier age if you have risk factors for colon cancer. On a yearly basis, your caregiver may provide home test kits to check for hidden blood in the stool. Use of a small camera at the end of a tube, to directly examine the colon (sigmoidoscopy or colonoscopy), can detect the earliest forms of colorectal cancer. Talk to your caregiver about this at  age 68, when routine screening begins. Direct examination of the colon should be repeated every 5 to 10 years through age 84, unless early forms of pre-cancerous polyps or small growths are found.  Hepatitis C blood testing is recommended for all people born from [birth year] through 1964 and any individual with known risks for hepatitis C.  Practice safe sex. Use condoms and avoid high-risk sexual practices to reduce the spread of sexually transmitted infections (STIs). Sexually active women aged 66 and younger should be checked for Chlamydia, which is a common sexually transmitted infection. Older women with new or multiple partners should also be tested for Chlamydia. Testing for other STIs is recommended if you are sexually active and at increased risk.  Osteoporosis is a disease in which the bones lose minerals and strength with aging. This can result in serious bone fractures. The risk of osteoporosis can be identified using a bone density scan. Women ages 65 and over and women at risk for fractures or osteoporosis should discuss screening with their caregivers. Ask your caregiver whether you should be taking a calcium supplement or vitamin D to reduce the rate of osteoporosis.  Menopause can be associated with physical symptoms and risks. Hormone replacement therapy is available to decrease symptoms and risks. You should talk to your caregiver about whether hormone replacement therapy is right for you.  Use sunscreen. Apply sunscreen liberally and repeatedly throughout the day. You should seek shade when your shadow is shorter than you. Protect yourself by wearing long sleeves, pants, a wide-brimmed hat, and sunglasses year round, whenever you are outdoors.  Notify your caregiver of new moles or changes in moles, especially if there is a change in shape or color. Also notify your caregiver if a mole is larger than the size of a pencil eraser.  Stay current with your immunizations. Document Released:  03/09/2011 Document Revised: 12/19/2012 Document Reviewed: 03/09/2011 [facility] Patient Information 2014 [place].

## 2014-02-23 ENCOUNTER — Ambulatory Visit (INDEPENDENT_AMBULATORY_CARE_PROVIDER_SITE_OTHER): Payer: BC Managed Care – PPO

## 2014-02-23 DIAGNOSIS — Z23 Encounter for immunization: Secondary | ICD-10-CM

## 2014-02-26 LAB — TB SKIN TEST
Induration: 0 mm
TB Skin Test: NEGATIVE

## 2014-10-08 LAB — HM PAP SMEAR

## 2014-10-08 LAB — HM MAMMOGRAPHY

## 2014-10-19 ENCOUNTER — Encounter: Payer: BC Managed Care – PPO | Admitting: Internal Medicine

## 2015-03-12 ENCOUNTER — Encounter: Payer: BC Managed Care – PPO | Admitting: Internal Medicine

## 2015-05-15 ENCOUNTER — Ambulatory Visit (INDEPENDENT_AMBULATORY_CARE_PROVIDER_SITE_OTHER): Payer: 59

## 2015-05-15 DIAGNOSIS — Z111 Encounter for screening for respiratory tuberculosis: Secondary | ICD-10-CM | POA: Diagnosis not present

## 2015-05-17 ENCOUNTER — Telehealth: Payer: Self-pay

## 2015-05-17 LAB — TB SKIN TEST
Induration: 0 mm
TB Skin Test: NEGATIVE

## 2015-05-17 NOTE — Telephone Encounter (Signed)
Patient came in for TB read---result is negative, i have completed form patient supplied partially---2nd sheet needs to be completed/signed by dr leschber---i have left message on stefannie/cma's desk to have dr leschber sign and complete, and call patient back when ready for pick up

## 2015-08-14 ENCOUNTER — Ambulatory Visit: Payer: BC Managed Care – PPO | Admitting: Family Medicine

## 2015-08-20 ENCOUNTER — Encounter: Payer: Self-pay | Admitting: Internal Medicine

## 2015-08-20 ENCOUNTER — Ambulatory Visit (INDEPENDENT_AMBULATORY_CARE_PROVIDER_SITE_OTHER): Payer: 59 | Admitting: Internal Medicine

## 2015-08-20 ENCOUNTER — Other Ambulatory Visit (INDEPENDENT_AMBULATORY_CARE_PROVIDER_SITE_OTHER): Payer: 59

## 2015-08-20 ENCOUNTER — Telehealth: Payer: Self-pay | Admitting: Internal Medicine

## 2015-08-20 VITALS — BP 100/68 | HR 74 | Temp 98.1°F | Ht 66.0 in | Wt 157.5 lb

## 2015-08-20 DIAGNOSIS — Z114 Encounter for screening for human immunodeficiency virus [HIV]: Secondary | ICD-10-CM

## 2015-08-20 DIAGNOSIS — Z0000 Encounter for general adult medical examination without abnormal findings: Secondary | ICD-10-CM | POA: Diagnosis not present

## 2015-08-20 LAB — URINALYSIS, ROUTINE W REFLEX MICROSCOPIC
Bilirubin Urine: NEGATIVE
HGB URINE DIPSTICK: NEGATIVE
KETONES UR: NEGATIVE
LEUKOCYTES UA: NEGATIVE
NITRITE: NEGATIVE
Specific Gravity, Urine: <=1.005 — AB (ref 1.000–1.030)
Total Protein, Urine: NEGATIVE
URINE GLUCOSE: NEGATIVE
Urobilinogen, UA: 0.2 (ref 0.0–1.0)
pH: 7 (ref 5.0–8.0)

## 2015-08-20 LAB — HEPATIC FUNCTION PANEL
ALBUMIN: 4.6 g/dL (ref 3.5–5.2)
ALT: 14 U/L (ref 0–35)
AST: 14 U/L (ref 0–37)
Alkaline Phosphatase: 49 U/L (ref 39–117)
BILIRUBIN TOTAL: 0.7 mg/dL (ref 0.2–1.2)
Bilirubin, Direct: 0.2 mg/dL (ref 0.0–0.3)
Total Protein: 6.8 g/dL (ref 6.0–8.3)

## 2015-08-20 LAB — BASIC METABOLIC PANEL
BUN: 9 mg/dL (ref 6–23)
CO2: 30 meq/L (ref 19–32)
Calcium: 9.4 mg/dL (ref 8.4–10.5)
Chloride: 103 mEq/L (ref 96–112)
Creatinine, Ser: 0.91 mg/dL (ref 0.40–1.20)
GFR: 71.54 mL/min (ref >60.00)
GLUCOSE: 102 mg/dL — AB (ref 70–99)
POTASSIUM: 3.9 meq/L (ref 3.5–5.1)
Sodium: 140 mEq/L (ref 135–145)

## 2015-08-20 LAB — LIPID PANEL
CHOLESTEROL: 167 mg/dL (ref 0–200)
HDL: 57 mg/dL (ref >39.00)
LDL CALC: 101 mg/dL — AB (ref 0–99)
NonHDL: 110.13
TRIGLYCERIDES: 45 mg/dL (ref 0.0–149.0)
Total CHOL/HDL Ratio: 3
VLDL: 9 mg/dL (ref 0.0–40.0)

## 2015-08-20 LAB — CBC WITH DIFFERENTIAL/PLATELET
Basophils Absolute: 0 10*3/uL (ref 0.0–0.1)
Basophils Relative: 0.3 % (ref 0.0–3.0)
EOS PCT: 1.1 % (ref 0.0–5.0)
Eosinophils Absolute: 0 10*3/uL (ref 0.0–0.7)
HCT: 41 % (ref 36.0–46.0)
Hemoglobin: 13.8 g/dL (ref 12.0–15.0)
LYMPHS ABS: 1.2 10*3/uL (ref 0.7–4.0)
Lymphocytes Relative: 27.4 % (ref 12.0–46.0)
MCHC: 33.6 g/dL (ref 30.0–36.0)
MCV: 88.8 fl (ref 78.0–100.0)
MONO ABS: 0.3 10*3/uL (ref 0.1–1.0)
Monocytes Relative: 7.3 % (ref 3.0–12.0)
NEUTROS ABS: 2.8 10*3/uL (ref 1.4–7.7)
NEUTROS PCT: 63.9 % (ref 43.0–77.0)
PLATELETS: 148 10*3/uL — AB (ref 150.0–400.0)
RBC: 4.62 Mil/uL (ref 3.87–5.11)
RDW: 13.1 % (ref 11.5–15.5)
WBC: 4.3 10*3/uL (ref 4.0–10.5)

## 2015-08-20 LAB — TSH: TSH: 2.45 u[IU]/mL (ref 0.35–4.50)

## 2015-08-20 LAB — HIV ANTIBODY (ROUTINE TESTING W REFLEX): HIV: NONREACTIVE

## 2015-08-20 MED ORDER — CALCIUM-MAGNESIUM-VITAMIN D ER 600-40-500 MG-MG-UNIT PO TB24: 1.0000 | ORAL_TABLET | Freq: Two times a day (BID) | ORAL | Status: AC

## 2015-08-20 MED ORDER — VITAMIN D 1000 UNITS PO TABS: 1000.0000 [IU] | ORAL_TABLET | Freq: Every day | ORAL | Status: DC

## 2015-08-20 NOTE — Telephone Encounter (Signed)
Left message for pt to call our office to schedule a transfer CPE with one of our providers.  Pt completed CPE on 08/20/15, so needs 30 transfer to office appointment, then can schedule fasting labs and cpe for 08/20/16 or later.

## 2015-08-20 NOTE — Patient Instructions (Addendum)
It was good to see you today.  We have reviewed your prior records including labs and tests today  Health Maintenance reviewed - all recommended immunizations and age-appropriate screenings are up-to-date.  Test(s) ordered today. Your results will be released to [place] (or called to you) after review, usually within 72hours after test completion. If any changes need to be made, you will be notified at that same time.  Medications reviewed and updated, no changes recommended at this time.  We will help schedule followup in 12 months for annual exam and labs with new PCP at [facility], please call sooner if problems.  Health Maintenance, Female Adopting a healthy lifestyle and getting preventive care can go a long way to promote health and wellness. Talk with your health care provider about what schedule of regular examinations is right for you. This is a good chance for you to check in with your provider about disease prevention and staying healthy. In between checkups, there are plenty of things you can do on your own. Experts have done a lot of research about which lifestyle changes and preventive measures are most likely to keep you healthy. Ask your health care provider for more information. WEIGHT AND DIET  Eat a healthy diet  Be sure to include plenty of vegetables, fruits, low-fat dairy products, and lean protein.  Do not eat a lot of foods high in solid fats, added sugars, or salt.  Get regular exercise. This is one of the most important things you can do for your health.  Most adults should exercise for at least 150 minutes each week. The exercise should increase your heart rate and make you sweat (moderate-intensity exercise).  Most adults should also do strengthening exercises at least twice a week. This is in addition to the moderate-intensity exercise.  Maintain a healthy weight  Body mass index (BMI) is a measurement that can be used to identify possible weight problems.  It estimates body fat based on height and weight. Your health care provider can help determine your BMI and help you achieve or maintain a healthy weight.  For females 70 years of age and older:   A BMI below 18.5 is considered underweight.  A BMI of 18.5 to 24.9 is normal.  A BMI of 25 to 29.9 is considered overweight.  A BMI of 30 and above is considered obese.  Watch levels of cholesterol and blood lipids  You should start having your blood tested for lipids and cholesterol at 43 years of age, then have this test every 5 years.  You may need to have your cholesterol levels checked more often if:  Your lipid or cholesterol levels are high.  You are older than 43 years of age.  You are at high risk for heart disease.  CANCER SCREENING   Lung Cancer  Lung cancer screening is recommended for adults 3-1 years old who are at high risk for lung cancer because of a history of smoking.  A yearly low-dose CT scan of the lungs is recommended for people who:  Currently smoke.  Have quit within the past 15 years.  Have at least a 30-pack-year history of smoking. A pack year is smoking an average of one pack of cigarettes a day for 1 year.  Yearly screening should continue until it has been 15 years since you quit.  Yearly screening should stop if you develop a health problem that would prevent you from having lung cancer treatment.  Breast Cancer  Practice breast  self-awareness. This means understanding how your breasts normally appear and feel.  It also means doing regular breast self-exams. Let your health care provider know about any changes, no matter how small.  If you are in your 20s or 30s, you should have a clinical breast exam (CBE) by a health care provider every 1-3 years as part of a regular health exam.  If you are 40 or older, have a CBE every year. Also consider having a breast X-ray (mammogram) every year.  If you have a family history of breast cancer,  talk to your health care provider about genetic screening.  If you are at high risk for breast cancer, talk to your health care provider about having an MRI and a mammogram every year.  Breast cancer gene (BRCA) assessment is recommended for women who have family members with BRCA-related cancers. BRCA-related cancers include:  Breast.  Ovarian.  Tubal.  Peritoneal cancers.  Results of the assessment will determine the need for genetic counseling and BRCA1 and BRCA2 testing. Cervical Cancer Your health care provider may recommend that you be screened regularly for cancer of the pelvic organs (ovaries, uterus, and vagina). This screening involves a pelvic examination, including checking for microscopic changes to the surface of your cervix (Pap test). You may be encouraged to have this screening done every 3 years, beginning at age 30.  For women ages 63-65, health care providers may recommend pelvic exams and Pap testing every 3 years, or they may recommend the Pap and pelvic exam, combined with testing for human papilloma virus (HPV), every 5 years. Some types of HPV increase your risk of cervical cancer. Testing for HPV may also be done on women of any age with unclear Pap test results.  Other health care providers may not recommend any screening for nonpregnant women who are considered low risk for pelvic cancer and who do not have symptoms. Ask your health care provider if a screening pelvic exam is right for you.  If you have had past treatment for cervical cancer or a condition that could lead to cancer, you need Pap tests and screening for cancer for at least 20 years after your treatment. If Pap tests have been discontinued, your risk factors (such as having a new sexual partner) need to be reassessed to determine if screening should resume. Some women have medical problems that increase the chance of getting cervical cancer. In these cases, your health care provider may recommend more  frequent screening and Pap tests. Colorectal Cancer  This type of cancer can be detected and often prevented.  Routine colorectal cancer screening usually begins at 43 years of age and continues through 43 years of age.  Your health care provider may recommend screening at an earlier age if you have risk factors for colon cancer.  Your health care provider may also recommend using home test kits to check for hidden blood in the stool.  A small camera at the end of a tube can be used to examine your colon directly (sigmoidoscopy or colonoscopy). This is done to check for the earliest forms of colorectal cancer.  Routine screening usually begins at age 28.  Direct examination of the colon should be repeated every 5-10 years through 43 years of age. However, you may need to be screened more often if early forms of precancerous polyps or small growths are found. Skin Cancer  Check your skin from head to toe regularly.  Tell your health care provider about any new moles  or changes in moles, especially if there is a change in a mole's shape or color.  Also tell your health care provider if you have a mole that is larger than the size of a pencil eraser.  Always use sunscreen. Apply sunscreen liberally and repeatedly throughout the day.  Protect yourself by wearing long sleeves, pants, a wide-brimmed hat, and sunglasses whenever you are outside. HEART DISEASE, DIABETES, AND HIGH BLOOD PRESSURE   High blood pressure causes heart disease and increases the risk of stroke. High blood pressure is more likely to develop in:  People who have blood pressure in the high end of the normal range (130-139/85-89 mm Hg).  People who are overweight or obese.  People who are African American.  If you are 64-48 years of age, have your blood pressure checked every 3-5 years. If you are 75 years of age or older, have your blood pressure checked every year. You should have your blood pressure measured  twice--once when you are at a hospital or clinic, and once when you are not at a hospital or clinic. Record the average of the two measurements. To check your blood pressure when you are not at a hospital or clinic, you can use:  An automated blood pressure machine at a pharmacy.  A home blood pressure monitor.  If you are between 51 years and 59 years old, ask your health care provider if you should take aspirin to prevent strokes.  Have regular diabetes screenings. This involves taking a blood sample to check your fasting blood sugar level.  If you are at a normal weight and have a low risk for diabetes, have this test once every three years after 43 years of age.  If you are overweight and have a high risk for diabetes, consider being tested at a younger age or more often. PREVENTING INFECTION  Hepatitis B  If you have a higher risk for hepatitis B, you should be screened for this virus. You are considered at high risk for hepatitis B if:  You were born in a country where hepatitis B is common. Ask your health care provider which countries are considered high risk.  Your parents were born in a high-risk country, and you have not been immunized against hepatitis B (hepatitis B vaccine).  You have HIV or AIDS.  You use needles to inject street drugs.  You live with someone who has hepatitis B.  You have had sex with someone who has hepatitis B.  You get hemodialysis treatment.  You take certain medicines for conditions, including cancer, organ transplantation, and autoimmune conditions. Hepatitis C  Blood testing is recommended for:  Everyone born from [birth year] through 1964.  Anyone with known risk factors for hepatitis C. Sexually transmitted infections (STIs)  You should be screened for sexually transmitted infections (STIs) including gonorrhea and chlamydia if:  You are sexually active and are younger than 43 years of age.  You are older than 43 years of age and your  health care provider tells you that you are at risk for this type of infection.  Your sexual activity has changed since you were last screened and you are at an increased risk for chlamydia or gonorrhea. Ask your health care provider if you are at risk.  If you do not have HIV, but are at risk, it may be recommended that you take a prescription medicine daily to prevent HIV infection. This is called pre-exposure prophylaxis (PrEP). You are considered at risk if:  You are sexually active and do not regularly use condoms or know the HIV status of your partner(s).  You take drugs by injection.  You are sexually active with a partner who has HIV. Talk with your health care provider about whether you are at high risk of being infected with HIV. If you choose to begin PrEP, you should first be tested for HIV. You should then be tested every 3 months for as long as you are taking PrEP.  PREGNANCY   If you are premenopausal and you may become pregnant, ask your health care provider about preconception counseling.  If you may become pregnant, take 400 to 800 micrograms (mcg) of folic acid every day.  If you want to prevent pregnancy, talk to your health care provider about birth control (contraception). OSTEOPOROSIS AND MENOPAUSE   Osteoporosis is a disease in which the bones lose minerals and strength with aging. This can result in serious bone fractures. Your risk for osteoporosis can be identified using a bone density scan.  If you are 7 years of age or older, or if you are at risk for osteoporosis and fractures, ask your health care provider if you should be screened.  Ask your health care provider whether you should take a calcium or vitamin D supplement to lower your risk for osteoporosis.  Menopause may have certain physical symptoms and risks.  Hormone replacement therapy may reduce some of these symptoms and risks. Talk to your health care provider about whether hormone replacement  therapy is right for you.  HOME CARE INSTRUCTIONS   Schedule regular health, dental, and eye exams.  Stay current with your immunizations.   Do not use any tobacco products including cigarettes, chewing tobacco, or electronic cigarettes.  If you are pregnant, do not drink alcohol.  If you are breastfeeding, limit how much and how often you drink alcohol.  Limit alcohol intake to no more than 1 drink per day for nonpregnant women. One drink equals 12 ounces of beer, 5 ounces of wine, or 1 ounces of hard liquor.  Do not use street drugs.  Do not share needles.  Ask your health care provider for help if you need support or information about quitting drugs.  Tell your health care provider if you often feel depressed.  Tell your health care provider if you have ever been abused or do not feel safe at home.   This information is not intended to replace advice given to you by your health care provider. Make sure you discuss any questions you have with your health care provider.   Document Released: 03/09/2011 Document Revised: 09/14/2014 Document Reviewed: 07/26/2013 Elsevier Interactive Patient Education [employer].

## 2015-08-20 NOTE — Progress Notes (Signed)
Pre visit review using our clinic review tool, if applicable. No additional management support is needed unless otherwise documented below in the visit note. 

## 2015-08-20 NOTE — Progress Notes (Signed)
Subjective:    Patient ID: Linda Rodgers, female    DOB: [birth year], 44 y.o.   MRN: [id number]  HPI  patient is here today for annual physical. Patient feels well and has no complaints. Also reviewed chronic medical conditions, interval events and current concerns   Past Medical History  Diagnosis Date  . Heart murmur     pregnancy   Family History  Problem Relation Age of Onset  . Breast cancer Maternal Aunt 64  . Hypertension Mother   . Diabetes Father   . Parkinsonism Father   . Heart disease Father 40    MI x 2  . Diabetes Maternal Grandmother    Social History  Substance Use Topics  . Smoking status: Never Smoker   . Smokeless tobacco: None  . Alcohol Use: Yes    Review of Systems  Constitutional: Negative for fatigue and unexpected weight change.  Respiratory: Negative for cough, shortness of breath and wheezing.   Cardiovascular: Negative for chest pain, palpitations and leg swelling.  Gastrointestinal: Negative for nausea, abdominal pain and diarrhea.  Neurological: Negative for dizziness, weakness, light-headedness and headaches.  Psychiatric/Behavioral: Negative for dysphoric mood. The patient is not nervous/anxious.   All other systems reviewed and are negative.      Objective:    Physical Exam  Constitutional: She appears well-developed and well-nourished. No distress.  Cardiovascular: Normal rate, regular rhythm and normal heart sounds.   No murmur heard. Pulmonary/Chest: Effort normal and breath sounds normal. No respiratory distress.  Musculoskeletal: She exhibits no edema.    BP 100/68 mmHg  Pulse 74  Temp(Src) 98.1 F (36.7 C) (Oral)  Ht  (1.676 m)  Wt 157 lb 8 oz (71.442 kg)  BMI 25.43 kg/m2  SpO2 97%  LMP 08/07/2015 Wt Readings from Last 3 Encounters:  08/20/15 157 lb 8 oz (71.442 kg)  10/19/13 151 lb 1.9 oz (68.548 kg)  10/17/12 150 lb 1.9 oz (68.094 kg)    Lab Results  Component Value Date   WBC 4.0* 10/19/2013   HGB 15.1* 10/19/2013   HCT 44.8 10/19/2013   PLT 173.0 10/19/2013   GLUCOSE 93 10/19/2013   CHOL 182 10/19/2013   TRIG 38.0 10/19/2013   HDL 59.80 10/19/2013   LDLCALC 115* 10/19/2013   ALT 22 10/19/2013   AST 15 10/19/2013   NA 140 10/19/2013   K 4.3 10/19/2013   CL 104 10/19/2013   CREATININE 0.8 10/19/2013   BUN 7 10/19/2013   CO2 31 10/19/2013   TSH 1.26 10/19/2013    Mm Digital Diagnostic Unilat L  09/12/2007  DG DIAGNOSTIC UNILATERAL L CC and MLO view(s) were taken of the left breast.  DIGITAL UNILATERAL LEFT DIAGNOSTIC MAMMOGRAM:  CLINICAL DATA:  The patient returns for evaluation of calcifications in the left breast noted on recent screening study from [facility] dated 08-19-07.  Magnification views of the left breast demonstrate calcifications that are consistent with benign milk of calcium.  No suspicious linear forms are noted.  IMPRESSION:  Benign milk of calcium in the left breast.  Yearly screening should begin at age 64 unless clinically indicated earlier.  ASSESSMENT: Benign - BI-RADS 2  Screening mammogram of both breasts at age 65. , Provider: Evaristo Bury      Assessment & Plan:   CPX/z00.00 - Patient has been counseled on age-appropriate routine health concerns for screening and prevention. These are reviewed and up-to-date. Immunizations are up-to-date or declined. Labs ordered and reviewed.  Problem List  Items Addressed This Visit    None    Visit Diagnoses    Routine general medical examination at a health care facility    -  Primary    Relevant Orders    Basic metabolic panel    CBC with Differential/Platelet    Hepatic function panel    Lipid panel    TSH    Urinalysis, Routine w reflex microscopic ([facility])    HIV antibody    Screening for HIV without presence of risk factors        Relevant Orders    HIV antibody        Rene PaciValerie Nima B[place]urg, MD

## 2016-05-20 ENCOUNTER — Ambulatory Visit (INDEPENDENT_AMBULATORY_CARE_PROVIDER_SITE_OTHER): Payer: BLUE CROSS/BLUE SHIELD

## 2016-05-20 DIAGNOSIS — Z23 Encounter for immunization: Secondary | ICD-10-CM | POA: Diagnosis not present

## 2016-08-20 ENCOUNTER — Ambulatory Visit (INDEPENDENT_AMBULATORY_CARE_PROVIDER_SITE_OTHER): Payer: BLUE CROSS/BLUE SHIELD | Admitting: Family Medicine

## 2016-08-20 ENCOUNTER — Encounter: Payer: Self-pay | Admitting: Family Medicine

## 2016-08-20 VITALS — BP 100/78 | HR 81 | Temp 98.1°F | Ht 66.0 in | Wt 155.8 lb

## 2016-08-20 DIAGNOSIS — Z13220 Encounter for screening for lipoid disorders: Secondary | ICD-10-CM | POA: Diagnosis not present

## 2016-08-20 DIAGNOSIS — Z1329 Encounter for screening for other suspected endocrine disorder: Secondary | ICD-10-CM

## 2016-08-20 DIAGNOSIS — Z0000 Encounter for general adult medical examination without abnormal findings: Secondary | ICD-10-CM

## 2016-08-20 DIAGNOSIS — Z130 Encounter for screening for diseases of the blood and blood-forming organs and certain disorders involving the immune mechanism: Secondary | ICD-10-CM

## 2016-08-20 LAB — LIPID PANEL
CHOL/HDL RATIO: 3
Cholesterol: 172 mg/dL (ref 0–200)
HDL: 50.7 mg/dL (ref >39.00)
LDL Cholesterol: 110 mg/dL — ABNORMAL HIGH (ref 0–99)
NONHDL: 120.88
Triglycerides: 52 mg/dL (ref 0.0–149.0)
VLDL: 10.4 mg/dL (ref 0.0–40.0)

## 2016-08-20 LAB — COMPREHENSIVE METABOLIC PANEL
ALT: 23 U/L (ref 0–35)
AST: 17 U/L (ref 0–37)
Albumin: 4.5 g/dL (ref 3.5–5.2)
Alkaline Phosphatase: 51 U/L (ref 39–117)
BILIRUBIN TOTAL: 0.6 mg/dL (ref 0.2–1.2)
BUN: 14 mg/dL (ref 6–23)
CO2: 33 meq/L — AB (ref 19–32)
CREATININE: 0.85 mg/dL (ref 0.40–1.20)
Calcium: 9.5 mg/dL (ref 8.4–10.5)
Chloride: 103 mEq/L (ref 96–112)
GFR: 77.04 mL/min (ref >60.00)
GLUCOSE: 92 mg/dL (ref 70–99)
Potassium: 4.5 mEq/L (ref 3.5–5.1)
SODIUM: 141 meq/L (ref 135–145)
Total Protein: 6.8 g/dL (ref 6.0–8.3)

## 2016-08-20 LAB — TSH: TSH: 1.77 u[IU]/mL (ref 0.35–4.50)

## 2016-08-20 NOTE — Progress Notes (Signed)
   Subjective:    Patient ID: Linda Rodgers, female    DOB: 11/13/1970, 45 y.o.   MRN: [id number]  HPI    45 year old female presents to establish care.. Transfer from Dr. Felicity CoyerLeschber.  Last CPX in 08/2015  Sees Dr. Jennette KettleNeal for GYN care. Last CPX 10/2014.  BP Readings from Last 3 Encounters:  08/20/16 100/78  08/20/15 100/68  10/19/13 102/70     Feeling well overall. No concerns.   Diet: healthy diet. Exercise: walking 3-5 times a week, yoga  Social History /Family History/Past Medical History reviewed and updated if needed.  Review of Systems  Constitutional: Negative for fatigue.  HENT: Negative for ear pain and sinus pain.   Respiratory: Negative for cough and shortness of breath.   Cardiovascular: Negative for chest pain, palpitations and leg swelling.  Gastrointestinal: Negative for anal bleeding, constipation, diarrhea and nausea.  Genitourinary: Negative for genital sores and vaginal discharge.  Musculoskeletal: Negative for arthralgias.  Neurological: Negative for dizziness and syncope.  Psychiatric/Behavioral: Negative for behavioral problems and dysphoric mood.       Objective:   Physical Exam  Constitutional: Vital signs are normal. She appears well-developed and well-nourished. She is cooperative.  Non-toxic appearance. She does not appear ill. No distress.  HENT:  Head: Normocephalic.  Right Ear: Hearing, tympanic membrane, external ear and ear canal normal.  Left Ear: Hearing, tympanic membrane, external ear and ear canal normal.  Nose: Nose normal.  Eyes: Conjunctivae, EOM and lids are normal. Pupils are equal, round, and reactive to light. Lids are everted and swept, no foreign bodies found.  Neck: Trachea normal and normal range of motion. Neck supple. Carotid bruit is not present. No thyroid mass and no thyromegaly present.  Cardiovascular: Normal rate, regular rhythm, S1 normal, S2 normal, normal heart sounds and intact distal pulses.  Exam reveals no  gallop.   No murmur heard. Pulmonary/Chest: Effort normal and breath sounds normal. No respiratory distress. She has no wheezes. She has no rhonchi. She has no rales.  Abdominal: Soft. Normal appearance and bowel sounds are normal. She exhibits no distension, no fluid wave, no abdominal bruit and no mass. There is no hepatosplenomegaly. There is no tenderness. There is no rebound, no guarding and no CVA tenderness. No hernia.  Lymphadenopathy:    She has no cervical adenopathy.    She has no axillary adenopathy.  Neurological: She is alert. She has normal strength. No cranial nerve deficit or sensory deficit.  Skin: Skin is warm, dry and intact. No rash noted.  Psychiatric: Her speech is normal and behavior is normal. Judgment normal. Her mood appears not anxious. Cognition and memory are normal. She does not exhibit a depressed mood.          Assessment & Plan:  The patient's preventative maintenance and recommended screening tests for an annual wellness exam were reviewed in full today. Brought up to date unless services declined.  Counselled on the importance of diet, exercise, and its role in overall health and mortality. The patient's FH and SH was reviewed, including their home life, tobacco status, and drug and alcohol status.   Vaccines: Uptodate, flu vaccine 05/2016 Pap/DVE:  Per GYN Mammo:  At gyn office Bone Density:  Not indicated Colon: no family history  Smoking Status: nonsmoker ETOH/ drug use: none  HIV screen:  refused.

## 2016-08-20 NOTE — Progress Notes (Signed)
Pre visit review using our clinic review tool, if applicable. No additional management support is needed unless otherwise documented below in the visit note. 

## 2016-08-20 NOTE — Patient Instructions (Addendum)
Stop at lab on way out.  Keep up great work with healthy eating and regular exercise.   Preventive Care 40-64 Years, Female Preventive care refers to lifestyle choices and visits with your health care provider that can promote health and wellness. What does preventive care include?  A yearly physical exam. This is also called an annual well check.  Dental exams once or twice a year.  Routine eye exams. Ask your health care provider how often you should have your eyes checked.  Personal lifestyle choices, including:  Daily care of your teeth and gums.  Regular physical activity.  Eating a healthy diet.  Avoiding tobacco and drug use.  Limiting alcohol use.  Practicing safe sex.  Taking low-dose aspirin daily starting at age 55.  Taking vitamin and mineral supplements as recommended by your health care provider. What happens during an annual well check? The services and screenings done by your health care provider during your annual well check will depend on your age, overall health, lifestyle risk factors, and family history of disease. Counseling  Your health care provider may ask you questions about your:  Alcohol use.  Tobacco use.  Drug use.  Emotional well-being.  Home and relationship well-being.  Sexual activity.  Eating habits.  Work and work Statistician.  Method of birth control.  Menstrual cycle.  Pregnancy history. Screening  You may have the following tests or measurements:  Height, weight, and BMI.  Blood pressure.  Lipid and cholesterol levels. These may be checked every 5 years, or more frequently if you are over 34 years old.  Skin check.  Lung cancer screening. You may have this screening every year starting at age 46 if you have a 30-pack-year history of smoking and currently smoke or have quit within the past 15 years.  Fecal occult blood test (FOBT) of the stool. You may have this test every year starting at age 51.  Flexible  sigmoidoscopy or colonoscopy. You may have a sigmoidoscopy every 5 years or a colonoscopy every 10 years starting at age 36.  Hepatitis C blood test.  Hepatitis B blood test.  Sexually transmitted disease (STD) testing.  Diabetes screening. This is done by checking your blood sugar (glucose) after you have not eaten for a while (fasting). You may have this done every 1-3 years.  Mammogram. This may be done every 1-2 years. Talk to your health care provider about when you should start having regular mammograms. This may depend on whether you have a family history of breast cancer.  BRCA-related cancer screening. This may be done if you have a family history of breast, ovarian, tubal, or peritoneal cancers.  Pelvic exam and Pap test. This may be done every 3 years starting at age 35. Starting at age 50, this may be done every 5 years if you have a Pap test in combination with an HPV test.  Bone density scan. This is done to screen for osteoporosis. You may have this scan if you are at high risk for osteoporosis. Discuss your test results, treatment options, and if necessary, the need for more tests with your health care provider. Vaccines  Your health care provider may recommend certain vaccines, such as:  Influenza vaccine. This is recommended every year.  Tetanus, diphtheria, and acellular pertussis (Tdap, Td) vaccine. You may need a Td booster every 10 years.  Varicella vaccine. You may need this if you have not been vaccinated.  Zoster vaccine. You may need this after age 62.  Measles, mumps, and rubella (MMR) vaccine. You may need at least one dose of MMR if you were born in 1957 or later. You may also need a second dose.  Pneumococcal 13-valent conjugate (PCV13) vaccine. You may need this if you have certain conditions and were not previously vaccinated.  Pneumococcal polysaccharide (PPSV23) vaccine. You may need one or two doses if you smoke cigarettes or if you have certain  conditions.  Meningococcal vaccine. You may need this if you have certain conditions.  Hepatitis A vaccine. You may need this if you have certain conditions or if you travel or work in places where you may be exposed to hepatitis A.  Hepatitis B vaccine. You may need this if you have certain conditions or if you travel or work in places where you may be exposed to hepatitis B.  Haemophilus influenzae type b (Hib) vaccine. You may need this if you have certain conditions. Talk to your health care provider about which screenings and vaccines you need and how often you need them. This information is not intended to replace advice given to you by your health care provider. Make sure you discuss any questions you have with your health care provider. Document Released: 09/20/2015 Document Revised: 05/13/2016 Document Reviewed: 06/25/2015 Elsevier Interactive Patient Education  2017 [employer].

## 2016-08-21 LAB — CBC WITH DIFFERENTIAL/PLATELET
BASOS ABS: 0 10*3/uL (ref 0.0–0.1)
Basophils Relative: 0.4 % (ref 0.0–3.0)
EOS ABS: 0.1 10*3/uL (ref 0.0–0.7)
Eosinophils Relative: 1.4 % (ref 0.0–5.0)
HEMATOCRIT: 42.4 % (ref 36.0–46.0)
Hemoglobin: 14.5 g/dL (ref 12.0–15.0)
LYMPHS PCT: 29.8 % (ref 12.0–46.0)
Lymphs Abs: 1.5 10*3/uL (ref 0.7–4.0)
MCHC: 34.3 g/dL (ref 30.0–36.0)
MCV: 89.1 fl (ref 78.0–100.0)
Monocytes Absolute: 0.4 10*3/uL (ref 0.1–1.0)
Monocytes Relative: 7.6 % (ref 3.0–12.0)
NEUTROS ABS: 3 10*3/uL (ref 1.4–7.7)
NEUTROS PCT: 60.8 % (ref 43.0–77.0)
PLATELETS: 209 10*3/uL (ref 150.0–400.0)
RBC: 4.76 Mil/uL (ref 3.87–5.11)
RDW: 12.5 % (ref 11.5–15.5)
WBC: 4.9 10*3/uL (ref 4.0–10.5)

## 2017-06-03 ENCOUNTER — Ambulatory Visit (INDEPENDENT_AMBULATORY_CARE_PROVIDER_SITE_OTHER): Payer: BLUE CROSS/BLUE SHIELD

## 2017-06-03 DIAGNOSIS — Z23 Encounter for immunization: Secondary | ICD-10-CM

## 2017-08-18 ENCOUNTER — Telehealth: Payer: Self-pay | Admitting: Family Medicine

## 2017-08-18 DIAGNOSIS — Z13220 Encounter for screening for lipoid disorders: Secondary | ICD-10-CM

## 2017-08-18 NOTE — Telephone Encounter (Signed)
-----   Message from Alvina Chouerri J Walsh sent at 08/12/2017  9:31 AM EST ----- Regarding: Lab orders for Friday 12.14.18 Patient is scheduled for CPX labs, please order future labs, Thanks , Camelia Engerri

## 2017-08-20 ENCOUNTER — Other Ambulatory Visit (INDEPENDENT_AMBULATORY_CARE_PROVIDER_SITE_OTHER): Payer: BLUE CROSS/BLUE SHIELD

## 2017-08-20 DIAGNOSIS — Z13220 Encounter for screening for lipoid disorders: Secondary | ICD-10-CM | POA: Diagnosis not present

## 2017-08-20 LAB — COMPREHENSIVE METABOLIC PANEL
ALK PHOS: 40 U/L (ref 39–117)
ALT: 17 U/L (ref 0–35)
AST: 13 U/L (ref 0–37)
Albumin: 4.2 g/dL (ref 3.5–5.2)
BUN: 13 mg/dL (ref 6–23)
CHLORIDE: 104 meq/L (ref 96–112)
CO2: 29 meq/L (ref 19–32)
Calcium: 9.2 mg/dL (ref 8.4–10.5)
Creatinine, Ser: 0.78 mg/dL (ref 0.40–1.20)
GFR: 84.69 mL/min (ref >60.00)
GLUCOSE: 83 mg/dL (ref 70–99)
POTASSIUM: 4 meq/L (ref 3.5–5.1)
SODIUM: 139 meq/L (ref 135–145)
TOTAL PROTEIN: 6.6 g/dL (ref 6.0–8.3)
Total Bilirubin: 1 mg/dL (ref 0.2–1.2)

## 2017-08-20 LAB — LIPID PANEL
CHOL/HDL RATIO: 3
Cholesterol: 181 mg/dL (ref 0–200)
HDL: 62.2 mg/dL (ref >39.00)
LDL CALC: 110 mg/dL — AB (ref 0–99)
NONHDL: 119.2
Triglycerides: 45 mg/dL (ref 0.0–149.0)
VLDL: 9 mg/dL (ref 0.0–40.0)

## 2017-08-24 ENCOUNTER — Encounter: Payer: Self-pay | Admitting: Family Medicine

## 2017-08-24 ENCOUNTER — Other Ambulatory Visit: Payer: Self-pay

## 2017-08-24 ENCOUNTER — Ambulatory Visit (INDEPENDENT_AMBULATORY_CARE_PROVIDER_SITE_OTHER): Payer: BLUE CROSS/BLUE SHIELD | Admitting: Family Medicine

## 2017-08-24 VITALS — BP 90/60 | HR 71 | Temp 97.6°F | Ht 65.5 in | Wt 147.2 lb

## 2017-08-24 DIAGNOSIS — Z0000 Encounter for general adult medical examination without abnormal findings: Secondary | ICD-10-CM

## 2017-08-24 NOTE — Patient Instructions (Signed)
Keep up great work with healthy lifestyle. 

## 2017-08-24 NOTE — Progress Notes (Signed)
   Subjective:    Patient ID: Linda Rodgers, female    DOB: Aug 08, 1971, 46 y.o.   MRN: [id number]  HPI   The patient is here for annual wellness exam and preventative care.     She is doing well overall.   Regular menses.. No issues.   Diet: healthy  Exercise: daily walking treadmill    AHA 10- year risk .Marland Kitchen. 0.3%  Social History /Family History/Past Medical History reviewed in detail and updated in EMR if needed. Blood pressure 90/60, pulse 71, temperature 97.6 F (36.4 C), temperature source Oral, height 5' 5.5" (1.664 m), weight 147 lb 4 oz (66.8 kg), last menstrual period 08/23/2017.  Body mass index is 24.13 kg/m.   Review of Systems  Constitutional: Negative for fatigue and fever.  HENT: Negative for congestion.   Eyes: Negative for pain.  Respiratory: Negative for cough and shortness of breath.   Cardiovascular: Negative for chest pain, palpitations and leg swelling.  Gastrointestinal: Negative for abdominal pain.  Genitourinary: Negative for dysuria and vaginal bleeding.  Musculoskeletal: Negative for back pain.  Neurological: Negative for syncope, light-headedness and headaches.  Psychiatric/Behavioral: Negative for dysphoric mood.       Objective:   Physical Exam  Constitutional: Vital signs are normal. She appears well-developed and well-nourished. She is cooperative.  Non-toxic appearance. She does not appear ill. No distress.  HENT:  Head: Normocephalic.  Right Ear: Hearing, tympanic membrane, external ear and ear canal normal.  Left Ear: Hearing, tympanic membrane, external ear and ear canal normal.  Nose: Nose normal.  Eyes: Conjunctivae, EOM and lids are normal. Pupils are equal, round, and reactive to light. Lids are everted and swept, no foreign bodies found.  Neck: Trachea normal and normal range of motion. Neck supple. Carotid bruit is not present. No thyroid mass and no thyromegaly present.  Cardiovascular: Normal rate, regular rhythm, S1 normal,  S2 normal, normal heart sounds and intact distal pulses. Exam reveals no gallop.  No murmur heard. Pulmonary/Chest: Effort normal and breath sounds normal. No respiratory distress. She has no wheezes. She has no rhonchi. She has no rales.  Abdominal: Soft. Normal appearance and bowel sounds are normal. She exhibits no distension, no fluid wave, no abdominal bruit and no mass. There is no hepatosplenomegaly. There is no tenderness. There is no rebound, no guarding and no CVA tenderness. No hernia.  Lymphadenopathy:    She has no cervical adenopathy.    She has no axillary adenopathy.  Neurological: She is alert. She has normal strength. No cranial nerve deficit or sensory deficit.  Skin: Skin is warm, dry and intact. No rash noted.  Psychiatric: Her speech is normal and behavior is normal. Judgment normal. Her mood appears not anxious. Cognition and memory are normal. She does not exhibit a depressed mood.          Assessment & Plan:  The patient's preventative maintenance and recommended screening tests for an annual wellness exam were reviewed in full today. Brought up to date unless services declined.  Counselled on the importance of diet, exercise, and its role in overall health and mortality. The patient's FH and SH was reviewed, including their home life, tobacco status, and drug and alcohol status.   Vaccines: Uptodate, flu vaccine 05/2017 Pap/DVE:  Per GYN Mammo:  At gyn office Bone Density:  Not indicated Colon: no family history  Smoking Status: nonsmoker ETOH/ drug use: none HIV screen: refused.

## 2017-12-16 LAB — HM PAP SMEAR: HM Pap smear: NEGATIVE

## 2017-12-16 LAB — HM MAMMOGRAPHY

## 2018-06-23 ENCOUNTER — Ambulatory Visit (INDEPENDENT_AMBULATORY_CARE_PROVIDER_SITE_OTHER): Payer: BLUE CROSS/BLUE SHIELD

## 2018-06-23 ENCOUNTER — Ambulatory Visit: Payer: BLUE CROSS/BLUE SHIELD

## 2018-06-23 DIAGNOSIS — Z23 Encounter for immunization: Secondary | ICD-10-CM

## 2018-08-22 ENCOUNTER — Other Ambulatory Visit (INDEPENDENT_AMBULATORY_CARE_PROVIDER_SITE_OTHER): Payer: BLUE CROSS/BLUE SHIELD

## 2018-08-22 ENCOUNTER — Telehealth: Payer: Self-pay | Admitting: Family Medicine

## 2018-08-22 DIAGNOSIS — Z13220 Encounter for screening for lipoid disorders: Secondary | ICD-10-CM | POA: Diagnosis not present

## 2018-08-22 LAB — COMPREHENSIVE METABOLIC PANEL
ALK PHOS: 41 U/L (ref 39–117)
ALT: 19 U/L (ref 0–35)
AST: 14 U/L (ref 0–37)
Albumin: 4.6 g/dL (ref 3.5–5.2)
BUN: 9 mg/dL (ref 6–23)
CO2: 32 meq/L (ref 19–32)
Calcium: 9.7 mg/dL (ref 8.4–10.5)
Chloride: 100 mEq/L (ref 96–112)
Creatinine, Ser: 0.87 mg/dL (ref 0.40–1.20)
GFR: 74.34 mL/min (ref >60.00)
Glucose, Bld: 103 mg/dL — ABNORMAL HIGH (ref 70–99)
POTASSIUM: 4 meq/L (ref 3.5–5.1)
Sodium: 139 mEq/L (ref 135–145)
Total Bilirubin: 0.8 mg/dL (ref 0.2–1.2)
Total Protein: 6.7 g/dL (ref 6.0–8.3)

## 2018-08-22 LAB — LIPID PANEL
Cholesterol: 180 mg/dL (ref 0–200)
HDL: 66.8 mg/dL (ref >39.00)
LDL Cholesterol: 104 mg/dL — ABNORMAL HIGH (ref 0–99)
NonHDL: 113.39
Total CHOL/HDL Ratio: 3
Triglycerides: 46 mg/dL (ref 0.0–149.0)
VLDL: 9.2 mg/dL (ref 0.0–40.0)

## 2018-08-22 NOTE — Telephone Encounter (Signed)
-----   Message from Wendi MayaLauren Greeson, RT sent at 08/16/2018  8:07 AM EST ----- Regarding: Lab orders for Monday Dec 16th Please enter CPE lab orders for 08/22/18. Thanks!

## 2018-08-26 ENCOUNTER — Encounter: Payer: Self-pay | Admitting: Family Medicine

## 2018-08-26 ENCOUNTER — Ambulatory Visit (INDEPENDENT_AMBULATORY_CARE_PROVIDER_SITE_OTHER): Payer: BLUE CROSS/BLUE SHIELD | Admitting: Family Medicine

## 2018-08-26 VITALS — BP 118/70 | HR 81 | Temp 98.4°F | Ht 65.5 in | Wt 148.0 lb

## 2018-08-26 DIAGNOSIS — Z0000 Encounter for general adult medical examination without abnormal findings: Secondary | ICD-10-CM

## 2018-08-26 DIAGNOSIS — R7303 Prediabetes: Secondary | ICD-10-CM

## 2018-08-26 LAB — POCT GLYCOSYLATED HEMOGLOBIN (HGB A1C): Hemoglobin A1C: 5.1 % (ref 4.0–5.6)

## 2018-08-26 NOTE — Patient Instructions (Signed)
Work on low carb diet and keep up with exercise as discussed.

## 2018-08-26 NOTE — Progress Notes (Signed)
Subjective:    Patient ID: Linda PasseyGlenda B Rodgers, female    DOB: Dec 13, 1970, 47 y.o.   MRN: [id number]  HPI  The patient is here for annual wellness exam and preventative care.     Doing well overall.  She does have new prediabetes.. has been eating honey and bannana daily... has 2 family members with DM.  Lab Results  Component Value Date   CHOL 180 08/22/2018   HDL 66.80 08/22/2018   LDLCALC 104 (H) 08/22/2018   TRIG 46.0 08/22/2018   CHOLHDL 3 08/22/2018   Diet: healthy, does not eat ouit Exercise: daily.. walking, yoga an light weights.    Wt Readings from Last 3 Encounters:  08/26/18 148 lb (67.1 kg)  08/24/17 147 lb 4 oz (66.8 kg)  08/20/16 155 lb 12 oz (70.6 kg)  Body mass index is 24.25 kg/m.   Reviewed labs in detail.  Social History /Family History/Past Medical History reviewed in detail and updated in EMR if needed. Blood pressure 118/70, pulse 81, temperature 98.4 F (36.9 C), temperature source Oral, height 5' 5.5" (1.664 m), weight 148 lb (67.1 kg), SpO2 97 %.  Review of Systems  Constitutional: Negative for fatigue and fever.  HENT: Negative for congestion.   Eyes: Negative for pain.  Respiratory: Negative for cough and shortness of breath.   Cardiovascular: Negative for chest pain, palpitations and leg swelling.  Gastrointestinal: Negative for abdominal pain.  Genitourinary: Negative for dysuria and vaginal bleeding.  Musculoskeletal: Negative for back pain.  Neurological: Negative for syncope, light-headedness and headaches.  Psychiatric/Behavioral: Negative for dysphoric mood.       Objective:   Physical Exam Constitutional:      General: She is not in acute distress.    Appearance: Normal appearance. She is well-developed. She is not ill-appearing or toxic-appearing.  HENT:     Head: Normocephalic.     Right Ear: Hearing, tympanic membrane, ear canal and external ear normal.     Left Ear: Hearing, tympanic membrane, ear canal and external  ear normal.     Nose: Nose normal.  Eyes:     General: Lids are normal. Lids are everted, no foreign bodies appreciated.     Conjunctiva/sclera: Conjunctivae normal.     Pupils: Pupils are equal, round, and reactive to light.  Neck:     Musculoskeletal: Normal range of motion and neck supple.     Thyroid: No thyroid mass or thyromegaly.     Vascular: No carotid bruit.     Trachea: Trachea normal.  Cardiovascular:     Rate and Rhythm: Normal rate and regular rhythm.     Heart sounds: Normal heart sounds, S1 normal and S2 normal. No murmur. No gallop.   Pulmonary:     Effort: Pulmonary effort is normal. No respiratory distress.     Breath sounds: Normal breath sounds. No wheezing, rhonchi or rales.  Abdominal:     General: Bowel sounds are normal. There is no distension or abdominal bruit.     Palpations: Abdomen is soft. There is no fluid wave or mass.     Tenderness: There is no abdominal tenderness. There is no guarding or rebound.     Hernia: No hernia is present.  Lymphadenopathy:     Cervical: No cervical adenopathy.  Skin:    General: Skin is warm and dry.     Findings: No rash.  Neurological:     Mental Status: She is alert.     Cranial Nerves: No cranial nerve  deficit.     Sensory: No sensory deficit.  Psychiatric:        Mood and Affect: Mood is not anxious or depressed.        Speech: Speech normal.        Behavior: Behavior normal. Behavior is cooperative.        Judgment: Judgment normal.           Assessment & Plan:  The patient's preventative maintenance and recommended screening tests for an annual wellness exam were reviewed in full today. Brought up to date unless services declined.  Counselled on the importance of diet, exercise, and its role in overall health and mortality. The patient's FH and SH was reviewed, including their home life, tobacco status, and drug and alcohol status.   Vaccines:Uptodate, flu vaccine 06/2018 Pap/DVE:Per GYN, last  2016 Mammo:At gyn office Bone Density:Not indicated Colon:no  Early family history Smoking Status: nonsmoker ETOH/ drug ZOX:WRUE/use:none/ none HIV screen:refused.

## 2019-05-30 ENCOUNTER — Ambulatory Visit (INDEPENDENT_AMBULATORY_CARE_PROVIDER_SITE_OTHER): Payer: BLUE CROSS/BLUE SHIELD

## 2019-05-30 DIAGNOSIS — Z23 Encounter for immunization: Secondary | ICD-10-CM | POA: Diagnosis not present

## 2019-09-04 ENCOUNTER — Other Ambulatory Visit: Payer: BLUE CROSS/BLUE SHIELD

## 2019-09-05 ENCOUNTER — Encounter: Payer: BLUE CROSS/BLUE SHIELD | Admitting: Family Medicine

## 2019-09-07 ENCOUNTER — Telehealth: Payer: Self-pay

## 2019-09-07 NOTE — Telephone Encounter (Signed)
LVM to call clinic, pt needs COVID screen, front door and back lab info 12.31.2020 TLJ

## 2019-09-12 ENCOUNTER — Telehealth: Payer: Self-pay | Admitting: Family Medicine

## 2019-09-12 DIAGNOSIS — Z13220 Encounter for screening for lipoid disorders: Secondary | ICD-10-CM

## 2019-09-12 NOTE — Telephone Encounter (Signed)
-----   Message from Alvina Chou sent at 08/31/2019 10:20 AM EST ----- Regarding: Lab orders for Wednesday, 1.6.21 Patient is scheduled for CPX labs, please order future labs, Thanks , Camelia Eng

## 2019-09-13 ENCOUNTER — Other Ambulatory Visit (INDEPENDENT_AMBULATORY_CARE_PROVIDER_SITE_OTHER): Payer: 59

## 2019-09-13 ENCOUNTER — Other Ambulatory Visit: Payer: Self-pay

## 2019-09-13 DIAGNOSIS — Z13220 Encounter for screening for lipoid disorders: Secondary | ICD-10-CM | POA: Diagnosis not present

## 2019-09-13 LAB — LIPID PANEL
Cholesterol: 195 mg/dL (ref 0–200)
HDL: 60.4 mg/dL (ref >39.00)
LDL Cholesterol: 125 mg/dL — ABNORMAL HIGH (ref 0–99)
NonHDL: 134.17
Total CHOL/HDL Ratio: 3
Triglycerides: 46 mg/dL (ref 0.0–149.0)
VLDL: 9.2 mg/dL (ref 0.0–40.0)

## 2019-09-13 LAB — COMPREHENSIVE METABOLIC PANEL
ALT: 17 U/L (ref 0–35)
AST: 14 U/L (ref 0–37)
Albumin: 4.5 g/dL (ref 3.5–5.2)
Alkaline Phosphatase: 47 U/L (ref 39–117)
BUN: 15 mg/dL (ref 6–23)
CO2: 31 mEq/L (ref 19–32)
Calcium: 9.4 mg/dL (ref 8.4–10.5)
Chloride: 101 mEq/L (ref 96–112)
Creatinine, Ser: 0.93 mg/dL (ref 0.40–1.20)
GFR: 64.46 mL/min (ref >60.00)
Glucose, Bld: 95 mg/dL (ref 70–99)
Potassium: 3.8 mEq/L (ref 3.5–5.1)
Sodium: 138 mEq/L (ref 135–145)
Total Bilirubin: 0.6 mg/dL (ref 0.2–1.2)
Total Protein: 6.9 g/dL (ref 6.0–8.3)

## 2019-09-13 NOTE — Progress Notes (Signed)
No critical labs need to be addressed urgently. We will discuss labs in detail at upcoming office visit.   

## 2019-09-15 ENCOUNTER — Other Ambulatory Visit: Payer: Self-pay

## 2019-09-15 ENCOUNTER — Ambulatory Visit (INDEPENDENT_AMBULATORY_CARE_PROVIDER_SITE_OTHER): Payer: 59 | Admitting: Family Medicine

## 2019-09-15 ENCOUNTER — Encounter: Payer: Self-pay | Admitting: Family Medicine

## 2019-09-15 VITALS — BP 90/62 | HR 75 | Temp 98.3°F | Ht 65.5 in | Wt 149.0 lb

## 2019-09-15 DIAGNOSIS — Z0000 Encounter for general adult medical examination without abnormal findings: Secondary | ICD-10-CM | POA: Diagnosis not present

## 2019-09-15 NOTE — Progress Notes (Signed)
Chief Complaint  Patient presents with  . Annual Exam    History of Present Illness: HPI  The patient is here for annual wellness exam and preventative care.    FBG now < 100 LDL cholesterol slight above goal, nml trigs and HDL. Diet:  healthy Exercise: 1 hour a day wlaking and yoga 5 days a week.   no vaginal issue , no breast issues.  This visit occurred during the SARS-CoV-2 public health emergency.  Safety protocols were in place, including screening questions prior to the visit, additional usage of staff PPE, and extensive cleaning of exam room while observing appropriate contact time as indicated for disinfecting solutions.   COVID 19 screen:  No recent travel or known exposure to COVID19 The patient denies respiratory symptoms of COVID 19 at this time. The importance of social distancing was discussed today.     Review of Systems  Constitutional: Negative for chills and fever.  HENT: Negative for congestion and ear pain.   Eyes: Negative for pain and redness.  Respiratory: Negative for cough and shortness of breath.   Cardiovascular: Negative for chest pain, palpitations and leg swelling.  Gastrointestinal: Negative for abdominal pain, blood in stool, constipation, diarrhea, nausea and vomiting.  Genitourinary: Negative for dysuria.  Musculoskeletal: Negative for falls and myalgias.  Skin: Negative for rash.  Neurological: Negative for dizziness.  Psychiatric/Behavioral: Negative for depression. The patient is not nervous/anxious.       Past Medical History:  Diagnosis Date  . Heart murmur    pregnancy    reports that she has never smoked. She has never used smokeless tobacco. She reports that she does not drink alcohol or use drugs.   Current Outpatient Medications:  .  Calcium-Magnesium-Vitamin D 600-40-500 MG-MG-UNIT TB24, Take 1 tablet by mouth 2 (two) times daily., Disp: , Rfl:  .  cholecalciferol (VITAMIN D) 1000 UNITS tablet, Take 1 tablet (1,000 Units  total) by mouth daily., Disp: , Rfl:    Observations/Objective: Blood pressure 90/62, pulse 75, temperature 98.3 F (36.8 C), temperature source Temporal, height 5' 5.5" (1.664 m), weight 149 lb (67.6 kg), last menstrual period 09/07/2019, SpO2 98 %.  Physical Exam Constitutional:      General: She is not in acute distress.    Appearance: Normal appearance. She is well-developed. She is not ill-appearing or toxic-appearing.  HENT:     Head: Normocephalic.     Right Ear: Hearing, tympanic membrane, ear canal and external ear normal.     Left Ear: Hearing, tympanic membrane, ear canal and external ear normal.     Nose: Nose normal.  Eyes:     General: Lids are normal. Lids are everted, no foreign bodies appreciated.     Conjunctiva/sclera: Conjunctivae normal.     Pupils: Pupils are equal, round, and reactive to light.  Neck:     Thyroid: No thyroid mass or thyromegaly.     Vascular: No carotid bruit.     Trachea: Trachea normal.  Cardiovascular:     Rate and Rhythm: Normal rate and regular rhythm.     Heart sounds: Normal heart sounds, S1 normal and S2 normal. No murmur. No gallop.   Pulmonary:     Effort: Pulmonary effort is normal. No respiratory distress.     Breath sounds: Normal breath sounds. No wheezing, rhonchi or rales.  Abdominal:     General: Bowel sounds are normal. There is no distension or abdominal bruit.     Palpations: Abdomen is soft. There is no  fluid wave or mass.     Tenderness: There is no abdominal tenderness. There is no guarding or rebound.     Hernia: No hernia is present.  Musculoskeletal:     Cervical back: Normal range of motion and neck supple.  Lymphadenopathy:     Cervical: No cervical adenopathy.  Skin:    General: Skin is warm and dry.     Findings: No rash.  Neurological:     Mental Status: She is alert.     Cranial Nerves: No cranial nerve deficit.     Sensory: No sensory deficit.  Psychiatric:        Mood and Affect: Mood is not  anxious or depressed.        Speech: Speech normal.        Behavior: Behavior normal. Behavior is cooperative.        Judgment: Judgment normal.      Assessment and Plan The patient's preventative maintenance and recommended screening tests for an annual wellness exam were reviewed in full today. Brought up to date unless services declined.  Counselled on the importance of diet, exercise, and its role in overall health and mortality. The patient's FH and SH was reviewed, including their home life, tobacco status, and drug and alcohol status.   Vaccines:Uptodate, flu vaccine 05/2019 and tdap Pap/DVE:Per GYN, last 2019.. no longer seeing GYN Mammo:At gyn office, aunt with breast cancer Bone Density:Not indicated Colon:no early family history Smoking Status: nonsmoker ETOH/ drug IRS:WNIO/ none HIV screen:refused, neg in past     Kerby Nora, MD

## 2019-09-15 NOTE — Patient Instructions (Addendum)
Call to schedule mammogram on your own.  Work on low cholesterol diet and keep up your regular exercise.

## 2019-09-20 ENCOUNTER — Other Ambulatory Visit: Payer: Self-pay | Admitting: Family Medicine

## 2019-09-20 DIAGNOSIS — Z1231 Encounter for screening mammogram for malignant neoplasm of breast: Secondary | ICD-10-CM

## 2019-10-05 ENCOUNTER — Other Ambulatory Visit: Payer: 59

## 2019-10-05 ENCOUNTER — Encounter: Payer: Self-pay | Admitting: Family Medicine

## 2019-10-06 ENCOUNTER — Encounter: Payer: 59 | Admitting: Family Medicine

## 2019-10-27 ENCOUNTER — Ambulatory Visit: Payer: 59

## 2019-11-01 ENCOUNTER — Ambulatory Visit
Admission: RE | Admit: 2019-11-01 | Discharge: 2019-11-01 | Disposition: A | Discharge status: Home / Self Care | Payer: 59 | Source: Ambulatory Visit | Attending: Family Medicine | Admitting: Family Medicine

## 2019-11-01 ENCOUNTER — Other Ambulatory Visit: Payer: Self-pay

## 2019-11-01 DIAGNOSIS — Z1231 Encounter for screening mammogram for malignant neoplasm of breast: Secondary | ICD-10-CM

## 2020-05-30 ENCOUNTER — Ambulatory Visit (INDEPENDENT_AMBULATORY_CARE_PROVIDER_SITE_OTHER): Payer: 59

## 2020-05-30 ENCOUNTER — Other Ambulatory Visit: Payer: Self-pay

## 2020-05-30 DIAGNOSIS — Z23 Encounter for immunization: Secondary | ICD-10-CM | POA: Diagnosis not present

## 2020-08-27 ENCOUNTER — Telehealth: Payer: Self-pay | Admitting: Family Medicine

## 2020-08-27 DIAGNOSIS — Z13220 Encounter for screening for lipoid disorders: Secondary | ICD-10-CM

## 2020-08-27 DIAGNOSIS — Z1159 Encounter for screening for other viral diseases: Secondary | ICD-10-CM

## 2020-08-27 NOTE — Telephone Encounter (Signed)
-----   Message from Aquilla Solian, RT sent at 08/26/2020  3:07 PM EST ----- Regarding: Lab Orders for Friday 1.7.2022 Please place lab orders for Friday 1.7.2022, office visit for physical on Friday 1.14.2022 Thank you, Jones Bales RT(R)

## 2020-09-12 ENCOUNTER — Other Ambulatory Visit: Payer: Self-pay

## 2020-09-13 ENCOUNTER — Other Ambulatory Visit (INDEPENDENT_AMBULATORY_CARE_PROVIDER_SITE_OTHER): Payer: 59

## 2020-09-13 DIAGNOSIS — Z13220 Encounter for screening for lipoid disorders: Secondary | ICD-10-CM | POA: Diagnosis not present

## 2020-09-13 DIAGNOSIS — Z1159 Encounter for screening for other viral diseases: Secondary | ICD-10-CM

## 2020-09-13 LAB — LIPID PANEL
Cholesterol: 151 mg/dL (ref 0–200)
HDL: 57.8 mg/dL (ref >39.00)
LDL Cholesterol: 83 mg/dL (ref 0–99)
NonHDL: 93.14
Total CHOL/HDL Ratio: 3
Triglycerides: 51 mg/dL (ref 0.0–149.0)
VLDL: 10.2 mg/dL (ref 0.0–40.0)

## 2020-09-13 LAB — COMPREHENSIVE METABOLIC PANEL
ALT: 17 U/L (ref 0–35)
AST: 14 U/L (ref 0–37)
Albumin: 4.4 g/dL (ref 3.5–5.2)
Alkaline Phosphatase: 52 U/L (ref 39–117)
BUN: 11 mg/dL (ref 6–23)
CO2: 32 mEq/L (ref 19–32)
Calcium: 8.9 mg/dL (ref 8.4–10.5)
Chloride: 102 mEq/L (ref 96–112)
Creatinine, Ser: 0.81 mg/dL (ref 0.40–1.20)
GFR: 85.75 mL/min (ref >60.00)
Glucose, Bld: 91 mg/dL (ref 70–99)
Potassium: 4 mEq/L (ref 3.5–5.1)
Sodium: 138 mEq/L (ref 135–145)
Total Bilirubin: 0.5 mg/dL (ref 0.2–1.2)
Total Protein: 6.7 g/dL (ref 6.0–8.3)

## 2020-09-13 NOTE — Progress Notes (Signed)
No critical labs need to be addressed urgently. We will discuss labs in detail at upcoming office visit.   

## 2020-09-16 LAB — HEPATITIS C ANTIBODY
Hepatitis C Ab: NONREACTIVE
SIGNAL TO CUT-OFF: 0 (ref <1.00)

## 2020-09-19 ENCOUNTER — Other Ambulatory Visit: Payer: Self-pay | Admitting: Family Medicine

## 2020-09-19 DIAGNOSIS — Z0000 Encounter for general adult medical examination without abnormal findings: Secondary | ICD-10-CM

## 2020-09-20 ENCOUNTER — Ambulatory Visit (INDEPENDENT_AMBULATORY_CARE_PROVIDER_SITE_OTHER): Payer: 59 | Admitting: Family Medicine

## 2020-09-20 ENCOUNTER — Other Ambulatory Visit: Payer: Self-pay

## 2020-09-20 VITALS — BP 110/78 | HR 76 | Temp 98.4°F | Ht 65.1 in | Wt 148.0 lb

## 2020-09-20 DIAGNOSIS — Z0000 Encounter for general adult medical examination without abnormal findings: Secondary | ICD-10-CM | POA: Diagnosis not present

## 2020-09-20 DIAGNOSIS — Z1211 Encounter for screening for malignant neoplasm of colon: Secondary | ICD-10-CM

## 2020-09-20 NOTE — Patient Instructions (Addendum)
Stop at lab on way out pick up stool cards.  Keep up great work on heathy lifestyle.

## 2020-09-20 NOTE — Progress Notes (Signed)
Patient ID: Linda Rodgers, female    DOB: 08-30-1971, 50 y.o.   MRN: [id number]  This visit was conducted in person.  BP 110/78   Pulse 76   Temp 98.4 F (36.9 C) (Temporal)   Ht 5' 5.1" (1.654 m)   Wt 148 lb (67.1 kg)   LMP 08/29/2020 (Exact Date)   SpO2 99%   BMI 24.55 kg/m    CC: annual physical Subjective:   HPI: Linda Rodgers is a 50 y.o. female presenting on 09/20/2020 for Annual Exam (No concerns)  Lab Results  Component Value Date   CHOL 151 09/13/2020   HDL 57.80 09/13/2020   LDLCALC 83 09/13/2020   TRIG 51.0 09/13/2020   CHOLHDL 3 09/13/2020    Diet: healthy.. has cut out saturated fats, high chol foods. Exercise: daily, walking, yoga   Reviewed labs in detail.  The 10-year ASCVD risk score Denman George DC Montez Hageman., et al., 2013) is: 0.5%   Values used to calculate the score:     Age: 63 years     Sex: Female     Is Non-Hispanic African American: No     Diabetic: No     Tobacco smoker: No     Systolic Blood Pressure: 110 mmHg     Is BP treated: No     HDL Cholesterol: 57.8 mg/dL     Total Cholesterol: 151 mg/dL       Relevant past medical, surgical, family and social history reviewed and updated as indicated. Interim medical history since our last visit reviewed. Allergies and medications reviewed and updated. Outpatient Medications Prior to Visit  Medication Sig Dispense Refill  . Calcium-Magnesium-Vitamin D 600-40-500 MG-MG-UNIT TB24 Take 1 tablet by mouth 2 (two) times daily.    . cholecalciferol (VITAMIN D) 1000 UNITS tablet Take 1 tablet (1,000 Units total) by mouth daily.     No facility-administered medications prior to visit.     Per HPI unless specifically indicated in ROS section below Review of Systems  Constitutional: Negative for fatigue and fever.  HENT: Negative for congestion.   Eyes: Negative for pain.  Respiratory: Negative for cough and shortness of breath.   Cardiovascular: Negative for chest pain, palpitations and leg  swelling.  Gastrointestinal: Negative for abdominal pain.  Genitourinary: Negative for dysuria and vaginal bleeding.  Musculoskeletal: Negative for back pain.  Neurological: Negative for syncope, light-headedness and headaches.  Psychiatric/Behavioral: Negative for dysphoric mood.   Objective:  BP 110/78   Pulse 76   Temp 98.4 F (36.9 C) (Temporal)   Ht 5' 5.1" (1.654 m)   Wt 148 lb (67.1 kg)   LMP 08/29/2020 (Exact Date)   SpO2 99%   BMI 24.55 kg/m   Wt Readings from Last 3 Encounters:  09/20/20 148 lb (67.1 kg)  09/15/19 149 lb (67.6 kg)  08/26/18 148 lb (67.1 kg)      Physical Exam Constitutional:      General: She is not in acute distress.Vital signs are normal.     Appearance: Normal appearance. She is well-developed and well-nourished. She is not ill-appearing or toxic-appearing.  HENT:     Head: Normocephalic.     Right Ear: Hearing, tympanic membrane, ear canal and external ear normal. Tympanic membrane is not erythematous, retracted or bulging.     Left Ear: Hearing, tympanic membrane, ear canal and external ear normal. Tympanic membrane is not erythematous, retracted or bulging.     Nose: No mucosal edema or rhinorrhea.  Right Sinus: No maxillary sinus tenderness or frontal sinus tenderness.     Left Sinus: No maxillary sinus tenderness or frontal sinus tenderness.     Mouth/Throat:     Mouth: Oropharynx is clear and moist and mucous membranes are normal.     Pharynx: Uvula midline.  Eyes:     General: Lids are normal. Lids are everted, no foreign bodies appreciated.     Extraocular Movements: EOM normal.     Conjunctiva/sclera: Conjunctivae normal.     Pupils: Pupils are equal, round, and reactive to light.  Neck:     Thyroid: No thyroid mass or thyromegaly.     Vascular: No carotid bruit.     Trachea: Trachea normal.  Cardiovascular:     Rate and Rhythm: Normal rate and regular rhythm.     Pulses: Normal pulses and intact distal pulses.     Heart  sounds: Normal heart sounds, S1 normal and S2 normal. No murmur heard. No friction rub. No gallop.   Pulmonary:     Effort: Pulmonary effort is normal. No tachypnea or respiratory distress.     Breath sounds: Normal breath sounds. No decreased breath sounds, wheezing, rhonchi or rales.  Abdominal:     General: Bowel sounds are normal.     Palpations: Abdomen is soft.     Tenderness: There is no abdominal tenderness.  Musculoskeletal:     Cervical back: Normal range of motion and neck supple.  Skin:    General: Skin is warm, dry and intact.     Findings: No rash.  Neurological:     Mental Status: She is alert.  Psychiatric:        Mood and Affect: Mood is not anxious or depressed.        Speech: Speech normal.        Behavior: Behavior normal. Behavior is cooperative.        Thought Content: Thought content normal.        Cognition and Memory: Cognition and memory normal.        Judgment: Judgment normal.       Results for orders placed or performed in visit on 09/13/20  Hepatitis C antibody  Result Value Ref Range   Hepatitis C Ab NON-REACTIVE NON-REACTI   SIGNAL TO CUT-OFF 0.00 <1.00  Comprehensive metabolic panel  Result Value Ref Range   Sodium 138 135 - 145 mEq/L   Potassium 4.0 3.5 - 5.1 mEq/L   Chloride 102 96 - 112 mEq/L   CO2 32 19 - 32 mEq/L   Glucose, Bld 91 70 - 99 mg/dL   BUN 11 6 - 23 mg/dL   Creatinine, Ser 1.61 0.40 - 1.20 mg/dL   Total Bilirubin 0.5 0.2 - 1.2 mg/dL   Alkaline Phosphatase 52 39 - 117 U/L   AST 14 0 - 37 U/L   ALT 17 0 - 35 U/L   Total Protein 6.7 6.0 - 8.3 g/dL   Albumin 4.4 3.5 - 5.2 g/dL   GFR 09.60 >45.40 mL/min   Calcium 8.9 8.4 - 10.5 mg/dL  Lipid panel  Result Value Ref Range   Cholesterol 151 0 - 200 mg/dL   Triglycerides 98.1 0.0 - 149.0 mg/dL   HDL 19.14 >78.29 mg/dL   VLDL 56.2 0.0 - 13.0 mg/dL   LDL Cholesterol 83 0 - 99 mg/dL   Total CHOL/HDL Ratio 3    NonHDL 93.14     This visit occurred during the SARS-CoV-2  public health emergency.  Safety protocols were in place, including screening questions prior to the visit, additional usage of staff PPE, and extensive cleaning of exam room while observing appropriate contact time as indicated for disinfecting solutions.   COVID 19 screen:  No recent travel or known exposure to COVID19 The patient denies respiratory symptoms of COVID 19 at this time. The importance of social distancing was discussed today.   Assessment and Plan The patient's preventative maintenance and recommended screening tests for an annual wellness exam were reviewed in full today. Brought up to date unless services declined.  Counselled on the importance of diet, exercise, and its role in overall health and mortality. The patient's FH and SH was reviewed, including their home life, tobacco status, and drug and alcohol status.   Vaccines:Uptodate, flu vaccine9/2021,  and tdap  Discussed COVID19 vaccine side effects and benefits. Strongly encouraged the patient to get the vaccine. Questions answered. Pap/DVE:Per GYN, last 2019.. no longer seeing GYN.. repeat due   In 2024 Mammo:scheduled 11/04/20, aunt with breast cancer Bone Density:Not indicated Colon:noearlyfamily history, but now indicated if insurance covers... she wished to do IFOB instead. Smoking Status: nonsmoker ETOH/ drug QGB:EEFE/ none HIV screen:refused, neg in past Hep C: done    Kerby Nora, MD

## 2020-09-26 ENCOUNTER — Other Ambulatory Visit (INDEPENDENT_AMBULATORY_CARE_PROVIDER_SITE_OTHER): Payer: 59

## 2020-09-26 DIAGNOSIS — Z1211 Encounter for screening for malignant neoplasm of colon: Secondary | ICD-10-CM

## 2020-09-26 LAB — FECAL OCCULT BLOOD, IMMUNOCHEMICAL: Fecal Occult Bld: NEGATIVE

## 2020-11-04 ENCOUNTER — Ambulatory Visit
Admission: RE | Admit: 2020-11-04 | Discharge: 2020-11-04 | Disposition: A | Discharge status: Home / Self Care | Payer: 59 | Source: Ambulatory Visit | Attending: Family Medicine | Admitting: Family Medicine

## 2020-11-04 ENCOUNTER — Other Ambulatory Visit: Payer: Self-pay

## 2020-11-04 DIAGNOSIS — Z0000 Encounter for general adult medical examination without abnormal findings: Secondary | ICD-10-CM

## 2021-05-30 ENCOUNTER — Other Ambulatory Visit: Payer: Self-pay

## 2021-05-30 ENCOUNTER — Ambulatory Visit (INDEPENDENT_AMBULATORY_CARE_PROVIDER_SITE_OTHER): Payer: 59

## 2021-05-30 DIAGNOSIS — Z23 Encounter for immunization: Secondary | ICD-10-CM | POA: Diagnosis not present

## 2021-09-22 ENCOUNTER — Telehealth: Payer: Self-pay | Admitting: Family Medicine

## 2021-09-22 ENCOUNTER — Other Ambulatory Visit: Payer: 59

## 2021-09-22 DIAGNOSIS — Z13220 Encounter for screening for lipoid disorders: Secondary | ICD-10-CM

## 2021-09-22 NOTE — Telephone Encounter (Signed)
-----   Message from Alvina Chou sent at 09/10/2021  8:23 AM EST ----- Regarding: Lab orders for Monday,1.16.23 Patient is scheduled for CPX labs, please order future labs, Thanks , Camelia Eng

## 2021-09-25 ENCOUNTER — Other Ambulatory Visit: Payer: Self-pay

## 2021-09-25 ENCOUNTER — Other Ambulatory Visit: Payer: Self-pay | Admitting: Family Medicine

## 2021-09-25 ENCOUNTER — Other Ambulatory Visit (INDEPENDENT_AMBULATORY_CARE_PROVIDER_SITE_OTHER): Payer: 59

## 2021-09-25 ENCOUNTER — Ambulatory Visit (INDEPENDENT_AMBULATORY_CARE_PROVIDER_SITE_OTHER): Payer: 59 | Admitting: Family Medicine

## 2021-09-25 ENCOUNTER — Encounter: Payer: Self-pay | Admitting: Family Medicine

## 2021-09-25 VITALS — BP 90/66 | HR 71 | Temp 98.2°F | Ht 65.25 in | Wt 147.1 lb

## 2021-09-25 DIAGNOSIS — Z1231 Encounter for screening mammogram for malignant neoplasm of breast: Secondary | ICD-10-CM

## 2021-09-25 DIAGNOSIS — Z0000 Encounter for general adult medical examination without abnormal findings: Secondary | ICD-10-CM

## 2021-09-25 DIAGNOSIS — Z23 Encounter for immunization: Secondary | ICD-10-CM

## 2021-09-25 DIAGNOSIS — Z91030 Bee allergy status: Secondary | ICD-10-CM | POA: Diagnosis not present

## 2021-09-25 DIAGNOSIS — Z13220 Encounter for screening for lipoid disorders: Secondary | ICD-10-CM | POA: Diagnosis not present

## 2021-09-25 DIAGNOSIS — Z1211 Encounter for screening for malignant neoplasm of colon: Secondary | ICD-10-CM | POA: Diagnosis not present

## 2021-09-25 LAB — COMPREHENSIVE METABOLIC PANEL
ALT: 19 U/L (ref 0–35)
AST: 16 U/L (ref 0–37)
Albumin: 4.5 g/dL (ref 3.5–5.2)
Alkaline Phosphatase: 49 U/L (ref 39–117)
BUN: 14 mg/dL (ref 6–23)
CO2: 32 mEq/L (ref 19–32)
Calcium: 9.3 mg/dL (ref 8.4–10.5)
Chloride: 102 mEq/L (ref 96–112)
Creatinine, Ser: 0.91 mg/dL (ref 0.40–1.20)
GFR: 74.03 mL/min (ref >60.00)
Glucose, Bld: 92 mg/dL (ref 70–99)
Potassium: 3.9 mEq/L (ref 3.5–5.1)
Sodium: 140 mEq/L (ref 135–145)
Total Bilirubin: 0.8 mg/dL (ref 0.2–1.2)
Total Protein: 6.9 g/dL (ref 6.0–8.3)

## 2021-09-25 LAB — LIPID PANEL
Cholesterol: 175 mg/dL (ref 0–200)
HDL: 64.7 mg/dL (ref >39.00)
LDL Cholesterol: 100 mg/dL — ABNORMAL HIGH (ref 0–99)
NonHDL: 110.03
Total CHOL/HDL Ratio: 3
Triglycerides: 48 mg/dL (ref 0.0–149.0)
VLDL: 9.6 mg/dL (ref 0.0–40.0)

## 2021-09-25 MED ORDER — EPINEPHRINE 0.3 MG/0.3ML IJ SOAJ: 0.3000 mg | INTRAMUSCULAR | 0 refills | Status: AC | PRN

## 2021-09-25 NOTE — Patient Instructions (Signed)
Here is some info I have gathered for you for a trusted medical source. Lipid Management With Diet, Uptodate Feb 20.2021, [place] and [place]  Although earlier, smaller trials suggested a benefit of garlic supplementation, a subsequent larger trial failed to demonstrate improvement in lipids with use of any of three different garlic preparations (raw, powdered, or aged).  Bergamot: Improvements in serum lipids have been reported in trials of patients with metabolic syndrome, nonalcoholic fatty liver disease and in hyperlipidemic patients resistant to statin treatment. However, high-quality data on the effects of bergamot are lacking.  Suggestions for you if you would like to try natural supplements to lower cholesterol.  1.Souble fiber : Psyllium In a meta-analysis of randomized trials of patients with both normal and elevated cholesterol levels, the addition of 10.2 g/day of psyllium lowered the LDL cholesterol by an average of 12.8 mg/dL   2. Omega 3s: Mixed results in studies. Given you triglycerides are normal I would not use this.  3.Red yeast rice ( 2.4 grams divided half in AM half in PM): Red yeast rice is a fermented rice product, most often taken as a supplement, which can improve serum cholesterol  via  method similar to prescription statins.  Red yeast rice supplements lowered total cholesterol (208 versus 251 mg/dL) and LDL cholesterol (135 versus 175 mg/dL) compared with placebo.  4. Plant sterol.. There are naturally occurring sterols and stanols in nuts, legumes, whole grains, fruits, vegetables, and plant oils. In addition, a number of manufactured products enriched with plant sterols and stanols are commercially available. The margarines containing these compounds (eg, Benecol and Take Control spreads) have been available the longest and are the most studied  In a trial of 150 patients with mild hypercholesterolemia,those consuming the fortified margarine experienced a 10 to 14  percent decrease in total cholesterol and LDL cholesterol.  5.Green Tea Catechins: .n a year-long randomized trial of more than 900 healthy postmenopausal women, green tea catechin supplements (1315 mg catechins/day) reduced total cholesterol and LDL cholesterol, increased triglycerides, and had no effect on HDL cholesterol   

## 2021-09-25 NOTE — Addendum Note (Signed)
Addended by: Alvina Chou on: 09/25/2021 04:31 PM   Modules accepted: Orders

## 2021-09-25 NOTE — Progress Notes (Signed)
No critical labs need to be addressed urgently. We will discuss labs in detail at upcoming office visit.   

## 2021-09-25 NOTE — Progress Notes (Signed)
Patient ID: Linda Rodgers, female    DOB: [birth year], 51 y.o.   MRN: [id number]  This visit was conducted in person.  BP 90/66    Pulse 71    Temp 98.2 F (36.8 C) (Temporal)    Ht 5' 5.25" (1.657 m)    Wt 147 lb 2 oz (66.7 kg)    LMP 09/01/2021    SpO2 97%    BMI 24.30 kg/m    CC:  Chief Complaint  Patient presents with   Annual Exam    Subjective:   HPI: Linda Rodgers is a 51 y.o. female presenting on 09/25/2021 for Annual Exam  Reviewed labs in detail with patient  Lab Results  Component Value Date   CHOL 175 09/25/2021   HDL 64.70 09/25/2021   LDLCALC 100 (H) 09/25/2021   TRIG 48.0 09/25/2021   CHOLHDL 3 09/25/2021   The 10-year ASCVD risk score (Arnett DK, et al., 2019) is: 0.4%   Values used to calculate the score:     Age: 46 years     Sex: Female     Is Non-Hispanic African American: No     Diabetic: No     Tobacco smoker: No     Systolic Blood Pressure: 90 mmHg     Is BP treated: No     HDL Cholesterol: 64.7 mg/dL     Total Cholesterol: 175 mg/dL   Wt Readings from Last 3 Encounters:  09/25/21 147 lb 2 oz (66.7 kg)  09/20/20 148 lb (67.1 kg)  09/15/19 149 lb (67.6 kg)   Body mass index is 24.3 kg/m.  BP Readings from Last 3 Encounters:  09/25/21 90/66  09/20/20 110/78  09/15/19 90/62    Diet: plant based diet, leans meats  Exercise: daily yoga and 45 min walk in evening.      Relevant past medical, surgical, family and social history reviewed and updated as indicated. Interim medical history since our last visit reviewed. Allergies and medications reviewed and updated. Outpatient Medications Prior to Visit  Medication Sig Dispense Refill   Calcium-Magnesium-Vitamin D 600-40-500 MG-MG-UNIT TB24 Take 1 tablet by mouth 2 (two) times daily.     cholecalciferol (VITAMIN D) 1000 UNITS tablet Take 1 tablet (1,000 Units total) by mouth daily.     No facility-administered medications prior to visit.     Per HPI unless specifically  indicated in ROS section below Review of Systems  Constitutional:  Negative for fatigue and fever.  HENT:  Negative for congestion.   Eyes:  Negative for pain.  Respiratory:  Negative for cough and shortness of breath.   Cardiovascular:  Negative for chest pain, palpitations and leg swelling.  Gastrointestinal:  Negative for abdominal pain.  Genitourinary:  Negative for dysuria and vaginal bleeding.  Musculoskeletal:  Negative for back pain.  Neurological:  Negative for syncope, light-headedness and headaches.  Psychiatric/Behavioral:  Negative for dysphoric mood.   Objective:  BP 90/66    Pulse 71    Temp 98.2 F (36.8 C) (Temporal)    Ht 5' 5.25" (1.657 m)    Wt 147 lb 2 oz (66.7 kg)    LMP 09/01/2021    SpO2 97%    BMI 24.30 kg/m   Wt Readings from Last 3 Encounters:  09/25/21 147 lb 2 oz (66.7 kg)  09/20/20 148 lb (67.1 kg)  09/15/19 149 lb (67.6 kg)      Physical Exam    Results for orders placed or performed in visit  on 09/25/21  Comprehensive metabolic panel  Result Value Ref Range   Sodium 140 135 - 145 mEq/L   Potassium 3.9 3.5 - 5.1 mEq/L   Chloride 102 96 - 112 mEq/L   CO2 32 19 - 32 mEq/L   Glucose, Bld 92 70 - 99 mg/dL   BUN 14 6 - 23 mg/dL   Creatinine, Ser 6.26 0.40 - 1.20 mg/dL   Total Bilirubin 0.8 0.2 - 1.2 mg/dL   Alkaline Phosphatase 49 39 - 117 U/L   AST 16 0 - 37 U/L   ALT 19 0 - 35 U/L   Total Protein 6.9 6.0 - 8.3 g/dL   Albumin 4.5 3.5 - 5.2 g/dL   GFR 94.85 >46.27 mL/min   Calcium 9.3 8.4 - 10.5 mg/dL  Lipid panel  Result Value Ref Range   Cholesterol 175 0 - 200 mg/dL   Triglycerides 03.5 0.0 - 149.0 mg/dL   HDL 00.93 >81.82 mg/dL   VLDL 9.6 0.0 - 99.3 mg/dL   LDL Cholesterol 716 (H) 0 - 99 mg/dL   Total CHOL/HDL Ratio 3    NonHDL 110.03     This visit occurred during the SARS-CoV-2 public health emergency.  Safety protocols were in place, including screening questions prior to the visit, additional usage of staff PPE, and extensive  cleaning of exam room while observing appropriate contact time as indicated for disinfecting solutions.   COVID 19 screen:  No recent travel or known exposure to COVID19 The patient denies respiratory symptoms of COVID 19 at this time. The importance of social distancing was discussed today.   Assessment and Plan   The patient's preventative maintenance and recommended screening tests for an annual wellness exam were reviewed in full today. Brought up to date unless services declined.  Counselled on the importance of diet, exercise, and its role in overall health and mortality. The patient's FH and SH was reviewed, including their home life, tobacco status, and drug and alcohol status.   Vaccines: Uptodate, flu vaccine 05/2020,  and td due  Discussed COVID19 vaccine side effects and benefits. Strongly encouraged the patient to get the vaccine. Questions answered. Pap/DVE:  Per GYN, last 2019.. no longer seeing GYN.. repeat due in 2024 Mammo:  normal 11/04/20, aunt with breast cancer, schedule March 1st Bone Density:  Not indicated Colon: no early family history, but now indicated if insurance covers... she wished to do IFOB instead... negative  ifob 09/26/20.Marland Kitchen will check ifob this year and start colonoscopy next year age 44.  Smoking Status: nonsmoker ETOH/ drug use: none/ none  HIV screen:  refused, neg in past Hep C: done   Problem List Items Addressed This Visit     History of bee sting allergy   Other Visit Diagnoses     Routine general medical examination at a health care facility    -  Primary   Colon cancer screening       Relevant Orders   Fecal occult blood, imunochemical([facility])      Meds ordered this encounter  Medications   EPINEPHrine 0.3 mg/0.3 mL IJ SOAJ injection    Sig: Inject 0.3 mg into the muscle as needed for anaphylaxis.    Dispense:  1 each    Refill:  0    Kerby Nora, MD

## 2021-09-25 NOTE — Addendum Note (Signed)
Addended by: Damita Lack on: 09/25/2021 04:31 PM   Modules accepted: Orders

## 2021-10-20 ENCOUNTER — Other Ambulatory Visit: Payer: Self-pay

## 2021-10-20 ENCOUNTER — Other Ambulatory Visit (INDEPENDENT_AMBULATORY_CARE_PROVIDER_SITE_OTHER): Payer: 59

## 2021-10-20 ENCOUNTER — Ambulatory Visit (INDEPENDENT_AMBULATORY_CARE_PROVIDER_SITE_OTHER): Payer: 59 | Admitting: Family

## 2021-10-20 ENCOUNTER — Encounter: Payer: Self-pay | Admitting: Family

## 2021-10-20 VITALS — BP 109/86 | HR 82 | Temp 98.6°F | Ht 66.0 in | Wt 149.0 lb

## 2021-10-20 DIAGNOSIS — J020 Streptococcal pharyngitis: Secondary | ICD-10-CM

## 2021-10-20 DIAGNOSIS — Z1211 Encounter for screening for malignant neoplasm of colon: Secondary | ICD-10-CM | POA: Diagnosis not present

## 2021-10-20 DIAGNOSIS — J029 Acute pharyngitis, unspecified: Secondary | ICD-10-CM

## 2021-10-20 DIAGNOSIS — B3731 Acute candidiasis of vulva and vagina: Secondary | ICD-10-CM | POA: Diagnosis not present

## 2021-10-20 LAB — POCT RAPID STREP A (OFFICE): Rapid Strep A Screen: POSITIVE — AB

## 2021-10-20 MED ORDER — AMOXICILLIN 500 MG PO CAPS: 500.0000 mg | ORAL_CAPSULE | Freq: Two times a day (BID) | ORAL | 0 refills | Status: DC

## 2021-10-20 MED ORDER — AMOXICILLIN 500 MG PO CAPS: 500.0000 mg | ORAL_CAPSULE | Freq: Two times a day (BID) | ORAL | 0 refills | Status: AC

## 2021-10-20 MED ORDER — FLUCONAZOLE 150 MG PO TABS: ORAL_TABLET | ORAL | 0 refills | Status: DC

## 2021-10-20 NOTE — Patient Instructions (Signed)
------------------------------------    You were found to be strep positive,  Take antibiotics that have been sent to the pharmacy.  Change your toothbrush after 24 hours on the antibiotics.  Gargle with warm salt water as needed for sore throat.   ------------------------------------  

## 2021-10-20 NOTE — Progress Notes (Signed)
Established Patient Office Visit  Subjective:  Patient ID: Linda Rodgers, female    DOB: 1970-09-27  Age: 51 y.o. MRN: [id number]  CC:  Chief Complaint  Patient presents with   Sore Throat    Pt stated-- sore throat, have white pathes, and pain when swallowing--4 days.    HPI Linda Rodgers is here today with concerns.   Four days ago started with sore throat, hurts to swallow, and saw white patches on the throat. Denies any other symptoms, does teach in a school. No fever. No ear pain. No cough or chest congestion.   Has used losenges and throat spray/ tylenol ibuprofen prn pain which helps slightly.   Past Medical History:  Diagnosis Date   Heart murmur    pregnancy    Past Surgical History:  Procedure Laterality Date   CESAREAN SECTION      breech twins   WISDOM TOOTH EXTRACTION  1987    Family History  Problem Relation Age of Onset   Hypertension Mother    Diabetes Father    Parkinsonism Father    Heart disease Father 21       MI x 2   Diabetes Maternal Grandmother    Breast cancer Maternal Aunt 64    Social History   Socioeconomic History   Marital status: Married    Spouse name: Not on file   Number of children: Not on file   Years of education: Not on file   Highest education level: Not on file  Occupational History   Not on file  Tobacco Use   Smoking status: Never   Smokeless tobacco: Never  Substance and Sexual Activity   Alcohol use: No   Drug use: No   Sexual activity: Yes    Birth control/protection: Surgical  Other Topics Concern   Not on file  Social History Narrative    Married, 3 children (77, 20)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   Social Determinants of Health   Financial Resource Strain: Not on file  Food Insecurity: Not on file  Transportation Needs: Not on file  Physical Activity: Not on file  Stress: Not on file  Social Connections: Not on file  Intimate Partner Violence: Not on file    Outpatient  Medications Prior to Visit  Medication Sig Dispense Refill   Calcium-Magnesium-Vitamin D 600-40-500 MG-MG-UNIT TB24 Take 1 tablet by mouth 2 (two) times daily.     cholecalciferol (VITAMIN D) 1000 UNITS tablet Take 1 tablet (1,000 Units total) by mouth daily.     EPINEPHrine 0.3 mg/0.3 mL IJ SOAJ injection Inject 0.3 mg into the muscle as needed for anaphylaxis. 1 each 0   No facility-administered medications prior to visit.    Allergies  Allergen Reactions   Neomycin     Per pt neomycin family   Neosporin [Neomycin-Polymyxin B Gu]     ROS Review of Systems  Constitutional:  Negative for chills and fever.  HENT:  Positive for sore throat and trouble swallowing (painful swallowing). Negative for congestion, ear pain and sinus pressure.   Respiratory:  Negative for cough, shortness of breath and wheezing.   Cardiovascular:  Negative for chest pain and palpitations.     Objective:    Physical Exam Vitals reviewed.  Constitutional:      General: She is not in acute distress.    Appearance: She is well-developed and normal weight. She is ill-appearing. She is not toxic-appearing or diaphoretic.  HENT:     Head: Normocephalic.  Right Ear: Tympanic membrane normal.     Left Ear: Tympanic membrane normal.     Nose: No congestion.     Mouth/Throat:     Mouth: Mucous membranes are moist.     Tonsils: Tonsillar exudate (bil) present. No tonsillar abscesses. 0 on the right. 0 on the left.  Neurological:     Mental Status: She is alert.    BP 109/86    Pulse 82    Temp 98.6 F (37 C)    Ht 5\' 6"  (1.676 m)    Wt 149 lb (67.6 kg)    SpO2 99%    BMI 24.05 kg/m  Wt Readings from Last 3 Encounters:  10/20/21 149 lb (67.6 kg)  09/25/21 147 lb 2 oz (66.7 kg)  09/20/20 148 lb (67.1 kg)     Health Maintenance Due  Topic Date Due   COVID-19 Vaccine (1) Never done   COLONOSCOPY (Pts 45-37yrs Insurance coverage will need to be confirmed)  Never done   COLON CANCER SCREENING ANNUAL  FOBT  09/26/2021    There are no preventive care reminders to display for this patient.  Lab Results  Component Value Date   TSH 1.77 08/20/2016   Lab Results  Component Value Date   WBC 4.9 08/20/2016   HGB 14.5 08/20/2016   HCT 42.4 08/20/2016   MCV 89.1 08/20/2016   PLT 209.0 08/20/2016   Lab Results  Component Value Date   NA 140 09/25/2021   K 3.9 09/25/2021   CO2 32 09/25/2021   GLUCOSE 92 09/25/2021   BUN 14 09/25/2021   CREATININE 0.91 09/25/2021   BILITOT 0.8 09/25/2021   ALKPHOS 49 09/25/2021   AST 16 09/25/2021   ALT 19 09/25/2021   PROT 6.9 09/25/2021   ALBUMIN 4.5 09/25/2021   CALCIUM 9.3 09/25/2021   GFR 74.03 09/25/2021   Lab Results  Component Value Date   HGBA1C 5.1 08/26/2018      Assessment & Plan:   Problem List Items Addressed This Visit   None   No orders of the defined types were placed in this encounter.   Follow-up: No follow-ups on file.    Eugenia Pancoast, FNP

## 2021-10-20 NOTE — Assessment & Plan Note (Signed)
Strep tested positive in office.  rx for amox 500 mg po bid x 10 days.  Ibuprofen/tyelnol prn sore throat/fever Pt told to F/u if no improvement in the next 2-3 days.  

## 2021-10-20 NOTE — Assessment & Plan Note (Signed)
Strep test in office positive will tx

## 2021-10-20 NOTE — Assessment & Plan Note (Signed)
rx diflucan as pcn gives her yeast infection. Take if needed.

## 2021-10-21 LAB — FECAL OCCULT BLOOD, IMMUNOCHEMICAL: Fecal Occult Bld: NEGATIVE

## 2021-11-05 ENCOUNTER — Ambulatory Visit: Payer: 59

## 2021-12-03 ENCOUNTER — Ambulatory Visit: Payer: 59

## 2021-12-31 ENCOUNTER — Ambulatory Visit: Payer: 59

## 2022-01-05 ENCOUNTER — Encounter (HOSPITAL_BASED_OUTPATIENT_CLINIC_OR_DEPARTMENT_OTHER): Payer: 59 | Admitting: Radiology

## 2022-01-05 DIAGNOSIS — Z1231 Encounter for screening mammogram for malignant neoplasm of breast: Secondary | ICD-10-CM

## 2022-01-12 ENCOUNTER — Other Ambulatory Visit (HOSPITAL_BASED_OUTPATIENT_CLINIC_OR_DEPARTMENT_OTHER): Payer: Self-pay | Admitting: Family Medicine

## 2022-01-12 DIAGNOSIS — Z1231 Encounter for screening mammogram for malignant neoplasm of breast: Secondary | ICD-10-CM

## 2022-01-16 ENCOUNTER — Encounter (HOSPITAL_BASED_OUTPATIENT_CLINIC_OR_DEPARTMENT_OTHER): Payer: Self-pay | Admitting: Radiology

## 2022-01-16 ENCOUNTER — Ambulatory Visit (HOSPITAL_BASED_OUTPATIENT_CLINIC_OR_DEPARTMENT_OTHER)
Admission: RE | Admit: 2022-01-16 | Discharge: 2022-01-16 | Disposition: A | Discharge status: Home / Self Care | Payer: 59 | Source: Ambulatory Visit | Attending: Family Medicine | Admitting: Family Medicine

## 2022-01-16 DIAGNOSIS — Z1231 Encounter for screening mammogram for malignant neoplasm of breast: Secondary | ICD-10-CM | POA: Insufficient documentation

## 2022-03-01 ENCOUNTER — Encounter: Payer: Self-pay | Admitting: Family Medicine

## 2022-03-02 ENCOUNTER — Ambulatory Visit (INDEPENDENT_AMBULATORY_CARE_PROVIDER_SITE_OTHER): Payer: 59 | Admitting: Family

## 2022-03-02 ENCOUNTER — Encounter: Payer: Self-pay | Admitting: Family

## 2022-03-02 VITALS — BP 92/62 | HR 79 | Temp 99.3°F | Resp 16 | Ht 66.0 in | Wt 144.4 lb

## 2022-03-02 DIAGNOSIS — H04123 Dry eye syndrome of bilateral lacrimal glands: Secondary | ICD-10-CM | POA: Diagnosis not present

## 2022-03-02 DIAGNOSIS — H1031 Unspecified acute conjunctivitis, right eye: Secondary | ICD-10-CM | POA: Insufficient documentation

## 2022-03-02 MED ORDER — CYCLOSPORINE 0.05 % OP EMUL: 1.0000 [drp] | Freq: Two times a day (BID) | OPHTHALMIC | 0 refills | Status: AC

## 2022-03-02 MED ORDER — OFLOXACIN 0.3 % OP SOLN: 1.0000 [drp] | Freq: Four times a day (QID) | OPHTHALMIC | 0 refills | Status: AC

## 2022-06-02 ENCOUNTER — Ambulatory Visit (INDEPENDENT_AMBULATORY_CARE_PROVIDER_SITE_OTHER): Payer: Commercial Managed Care - HMO

## 2022-06-02 DIAGNOSIS — Z23 Encounter for immunization: Secondary | ICD-10-CM

## 2022-06-03 ENCOUNTER — Ambulatory Visit: Payer: 59

## 2022-09-15 ENCOUNTER — Telehealth: Payer: Self-pay | Admitting: Family Medicine

## 2022-09-15 DIAGNOSIS — Z13220 Encounter for screening for lipoid disorders: Secondary | ICD-10-CM

## 2022-09-15 NOTE — Telephone Encounter (Signed)
-----   Message from Velna Hatchet, RT sent at 09/08/2022 11:46 AM EST ----- Regarding: Tue 1/16 lab Patient is scheduled for cpx, please order future labs.  Thanks, Anda Kraft

## 2022-09-22 ENCOUNTER — Other Ambulatory Visit (INDEPENDENT_AMBULATORY_CARE_PROVIDER_SITE_OTHER): Payer: Commercial Managed Care - HMO

## 2022-09-22 DIAGNOSIS — Z13220 Encounter for screening for lipoid disorders: Secondary | ICD-10-CM

## 2022-09-22 LAB — LIPID PANEL
Cholesterol: 190 mg/dL (ref 0–200)
HDL: 69.9 mg/dL (ref >39.00)
LDL Cholesterol: 110 mg/dL — ABNORMAL HIGH (ref 0–99)
NonHDL: 120.37
Total CHOL/HDL Ratio: 3
Triglycerides: 50 mg/dL (ref 0.0–149.0)
VLDL: 10 mg/dL (ref 0.0–40.0)

## 2022-09-22 LAB — COMPREHENSIVE METABOLIC PANEL
ALT: 19 U/L (ref 0–35)
AST: 17 U/L (ref 0–37)
Albumin: 4.5 g/dL (ref 3.5–5.2)
Alkaline Phosphatase: 60 U/L (ref 39–117)
BUN: 14 mg/dL (ref 6–23)
CO2: 31 mEq/L (ref 19–32)
Calcium: 9.4 mg/dL (ref 8.4–10.5)
Chloride: 100 mEq/L (ref 96–112)
Creatinine, Ser: 0.92 mg/dL (ref 0.40–1.20)
GFR: 72.56 mL/min (ref >60.00)
Glucose, Bld: 96 mg/dL (ref 70–99)
Potassium: 3.9 mEq/L (ref 3.5–5.1)
Sodium: 139 mEq/L (ref 135–145)
Total Bilirubin: 0.8 mg/dL (ref 0.2–1.2)
Total Protein: 6.8 g/dL (ref 6.0–8.3)

## 2022-09-22 NOTE — Progress Notes (Signed)
No critical labs need to be addressed urgently. We will discuss labs in detail at upcoming office visit.   

## 2022-09-29 ENCOUNTER — Ambulatory Visit: Payer: Commercial Managed Care - HMO | Admitting: Family Medicine

## 2022-09-29 ENCOUNTER — Encounter: Payer: Self-pay | Admitting: Family Medicine

## 2022-09-29 ENCOUNTER — Other Ambulatory Visit (HOSPITAL_COMMUNITY)
Admission: RE | Admit: 2022-09-29 | Discharge: 2022-09-29 | Disposition: A | Discharge status: Home / Self Care | Payer: Commercial Managed Care - HMO | Source: Ambulatory Visit | Attending: Family Medicine | Admitting: Family Medicine

## 2022-09-29 VITALS — BP 100/72 | HR 67 | Temp 97.7°F | Resp 16 | Ht 65.5 in | Wt 151.4 lb

## 2022-09-29 DIAGNOSIS — Z0000 Encounter for general adult medical examination without abnormal findings: Secondary | ICD-10-CM | POA: Insufficient documentation

## 2022-09-29 DIAGNOSIS — Z1211 Encounter for screening for malignant neoplasm of colon: Secondary | ICD-10-CM | POA: Diagnosis not present

## 2022-09-29 NOTE — Progress Notes (Signed)
Patient ID: Linda Rodgers, female    DOB: 10/28/1970, 52 y.o.   MRN: [id number]  This visit was conducted in person.  BP 100/72   Pulse 67   Temp 97.7 F (36.5 C)   Resp 16   Ht 5' 5.5" (1.664 m)   Wt 151 lb 6 oz (68.7 kg)   LMP 09/01/2021   SpO2 97%   BMI 24.81 kg/m    CC:  Chief Complaint  Patient presents with   Annual Exam    Subjective:   HPI: Linda Rodgers is a 52 y.o. female presenting on 09/29/2022 for Annual Exam  The patient presents for complete physical and review of chronic health problems. She also has the following acute concerns today: none  Reviewed labs and tail with patient..  Diet: heart healthy diet Exercise: 5-6 days week.  Body mass index is 24.81 kg/m.  Lab Results  Component Value Date   CHOL 190 09/22/2022   HDL 69.90 09/22/2022   LDLCALC 110 (H) 09/22/2022   TRIG 50.0 09/22/2022   CHOLHDL 3 09/22/2022   The 10-year ASCVD risk score (Arnett DK, et al., 2019) is: 0.5%   Values used to calculate the score:     Age: 34 years     Sex: Female     Is Non-Hispanic African American: No     Diabetic: No     Tobacco smoker: No     Systolic Blood Pressure: 716 mmHg     Is BP treated: No     HDL Cholesterol: 69.9 mg/dL     Total Cholesterol: 190 mg/dL        Relevant past medical, surgical, family and social history reviewed and updated as indicated. Interim medical history since our last visit reviewed. Allergies and medications reviewed and updated. Outpatient Medications Prior to Visit  Medication Sig Dispense Refill   Calcium-Magnesium-Vitamin D [id number] MG-MG-UNIT TB24 Take 1 tablet by mouth 2 (two) times daily.     cholecalciferol (VITAMIN D) 1000 UNITS tablet Take 1 tablet (1,000 Units total) by mouth daily.     cycloSPORINE (RESTASIS) 0.05 % ophthalmic emulsion Place 1 drop into the right eye 2 (two) times daily. 0.4 mL 0   EPINEPHrine 0.3 mg/0.3 mL IJ SOAJ injection Inject 0.3 mg into the muscle as needed for  anaphylaxis. 1 each 0   No facility-administered medications prior to visit.     Per HPI unless specifically indicated in ROS section below Review of Systems  Constitutional:  Negative for fatigue and fever.  HENT:  Negative for congestion.   Eyes:  Negative for pain.  Respiratory:  Negative for cough and shortness of breath.   Cardiovascular:  Negative for chest pain, palpitations and leg swelling.  Gastrointestinal:  Negative for abdominal pain.  Genitourinary:  Negative for dysuria and vaginal bleeding.  Musculoskeletal:  Negative for back pain.  Neurological:  Negative for syncope, light-headedness and headaches.  Psychiatric/Behavioral:  Negative for dysphoric mood.     Objective:  BP 100/72   Pulse 67   Temp 97.7 F (36.5 C)   Resp 16   Ht 5' 5.5" (1.664 m)   Wt 151 lb 6 oz (68.7 kg)   LMP 09/01/2021   SpO2 97%   BMI 24.81 kg/m   Wt Readings from Last 3 Encounters:  09/29/22 151 lb 6 oz (68.7 kg)  03/02/22 144 lb 7 oz (65.5 kg)  10/20/21 149 lb (67.6 kg)      Physical Exam Vitals and  nursing note reviewed.  Constitutional:      General: She is not in acute distress.    Appearance: Normal appearance. She is well-developed. She is not ill-appearing or toxic-appearing.  HENT:     Head: Normocephalic.     Right Ear: Hearing, tympanic membrane, ear canal and external ear normal.     Left Ear: Hearing, tympanic membrane, ear canal and external ear normal.     Nose: Nose normal.  Eyes:     General: Lids are normal. Lids are everted, no foreign bodies appreciated.     Conjunctiva/sclera: Conjunctivae normal.     Pupils: Pupils are equal, round, and reactive to light.  Neck:     Thyroid: No thyroid mass or thyromegaly.     Vascular: No carotid bruit.     Trachea: Trachea normal.  Cardiovascular:     Rate and Rhythm: Normal rate and regular rhythm.     Heart sounds: Normal heart sounds, S1 normal and S2 normal. No murmur heard.    No gallop.  Pulmonary:      Effort: Pulmonary effort is normal. No respiratory distress.     Breath sounds: Normal breath sounds. No wheezing, rhonchi or rales.  Chest:  Breasts:    Left: Normal.  Abdominal:     General: Bowel sounds are normal. There is no distension or abdominal bruit.     Palpations: Abdomen is soft. There is no fluid wave or mass.     Tenderness: There is no abdominal tenderness. There is no guarding or rebound.     Hernia: No hernia is present.  Genitourinary:    Exam position: Supine.     Labia:        Right: No rash, tenderness or lesion.        Left: No rash, tenderness or lesion.      Vagina: Normal.     Cervix: No cervical motion tenderness, discharge or friability.     Uterus: Not enlarged and not tender.      Adnexa:        Right: No mass, tenderness or fullness.         Left: No mass, tenderness or fullness.    Musculoskeletal:     Cervical back: Normal range of motion and neck supple.  Lymphadenopathy:     Cervical: No cervical adenopathy.  Skin:    General: Skin is warm and dry.     Findings: No rash.  Neurological:     Mental Status: She is alert.     Cranial Nerves: No cranial nerve deficit.     Sensory: No sensory deficit.  Psychiatric:        Mood and Affect: Mood is not anxious or depressed.        Speech: Speech normal.        Behavior: Behavior normal. Behavior is cooperative.        Judgment: Judgment normal.       Results for orders placed or performed in visit on 09/22/22  Comprehensive metabolic panel  Result Value Ref Range   Sodium 139 135 - 145 mEq/L   Potassium 3.9 3.5 - 5.1 mEq/L   Chloride 100 96 - 112 mEq/L   CO2 31 19 - 32 mEq/L   Glucose, Bld 96 70 - 99 mg/dL   BUN 14 6 - 23 mg/dL   Creatinine, Ser 9.52 0.40 - 1.20 mg/dL   Total Bilirubin 0.8 0.2 - 1.2 mg/dL   Alkaline Phosphatase 60 39 - 117 U/L  AST 17 0 - 37 U/L   ALT 19 0 - 35 U/L   Total Protein 6.8 6.0 - 8.3 g/dL   Albumin 4.5 3.5 - 5.2 g/dL   GFR 72.56 >60.00 mL/min   Calcium  9.4 8.4 - 10.5 mg/dL  Lipid panel  Result Value Ref Range   Cholesterol 190 0 - 200 mg/dL   Triglycerides 50.0 0.0 - 149.0 mg/dL   HDL 69.90 >39.00 mg/dL   VLDL 10.0 0.0 - 40.0 mg/dL   LDL Cholesterol 110 (H) 0 - 99 mg/dL   Total CHOL/HDL Ratio 3    NonHDL 120.37     Assessment and Plan The patient's preventative maintenance and recommended screening tests for an annual wellness exam were reviewed in full today. Brought up to date unless services declined.  Counselled on the importance of diet, exercise, and its role in overall health and mortality. The patient's FH and SH was reviewed, including their home life, tobacco status, and drug and alcohol status.   Vaccines: Uptodate, flu vaccine 05/2022,  and td given  Discussed COVID19 vaccine side effects and benefits. Strongly encouraged the patient to get the vaccine. Questions answered. Pap/DVE:  Per GYN, last 2019.. no longer seeing GYN.. repeat  DUE in 2024 Mammo:  normal 01/2022 aunt with breast cancer. Bone Density:  Not indicated Colon: no early family history, but now indicated if insurance covers... she wished to do IFOB instead... negative  ifob 09/26/20 , 10/2021, and planned colonoscopy  age 75, due now.  Smoking Status: nonsmoker ETOH/ drug use: none/ none  HIV screen:  refused, neg in past Hep C: done  Routine general medical examination at a health care facility  Colon cancer screening -     Ambulatory referral to Gastroenterology     Return in about 1 year (around 09/30/2023) for anuual physical with fasting labs prior.   Eliezer Lofts, MD

## 2022-09-29 NOTE — Patient Instructions (Signed)
We will set up colonoscopy with GI.  Please call the location of your choice from the menu below to schedule your Mammogram and/or Bone Density appointment.    [facility]                      Phone:  [phone], [place] [address]                                                             Services: Traditional and 3D Mammogram, [facility]                 Phone: [phone] [address], [place] [address]    Service: Bone Density ONLY   *this site does NOT perform mammograms  [facility]                        Phone:  [phone] [address], [place] [address]                                            Services:  3D Mammogram and [facility] at [facility]   Phone:  [phone]   [place], [place] [address]                                            Services: 3D Mammogram and [place]  [place] at [facility])  Phone:  [phone]   [address] [place], [place] [address]  Services:  3D Mammogram and Bone Density

## 2022-09-29 NOTE — Addendum Note (Signed)
Addended by: Barkley Bruns on: 09/29/2022 04:34 PM   Modules accepted: Orders

## 2022-10-05 LAB — CYTOLOGY - PAP
Comment: NEGATIVE
Diagnosis: NEGATIVE
High risk HPV: NEGATIVE

## 2022-11-11 ENCOUNTER — Ambulatory Visit (AMBULATORY_SURGERY_CENTER): Payer: Commercial Managed Care - HMO | Admitting: *Deleted

## 2022-11-11 VITALS — Ht 65.5 in | Wt 145.0 lb

## 2022-11-11 DIAGNOSIS — Z1211 Encounter for screening for malignant neoplasm of colon: Secondary | ICD-10-CM

## 2022-11-11 MED ORDER — NA SULFATE-K SULFATE-MG SULF 17.5-3.13-1.6 GM/177ML PO SOLN: 1.0000 | Freq: Once | ORAL | 0 refills | Status: AC

## 2022-11-11 NOTE — Progress Notes (Signed)
No egg or soy allergy known to patient  No issues known to pt with past sedation with any surgeries or procedures Patient denies ever being  intubated No issues moving head or neck No issues with swalowing  No FH of Malignant Hyperthermia Pt is not on diet pills Pt is not on home 02  Pt is not on blood thinners  Pt denies issues with constipation  Pt is not on dialysis Pt denies any upcoming cardiac testing Pt encouraged to use to use Singlecare or Goodrx to reduce cost  Patient's chart reviewed by Osvaldo Angst CNRA prior to previsit and patient appropriate for the [place].  Pre-visit completed and red dot placed by patient's name on their procedure day (on provider's schedule).  ,. Patient's chart reviewed by Osvaldo Angst CNRA prior to pre-visit and patient appropriate for the [place].  Pre-visit completed and red dot placed by patient's name on their procedure day (on provider's schedule).  Instructions reviewed with pt and pt states understanding. Instructed to review again prior to procedure. Pt states they will  Instructions sent by mail with coupon if applicable and by my chart Visit by [facility]

## 2022-11-16 ENCOUNTER — Encounter: Payer: Self-pay | Admitting: Gastroenterology

## 2022-11-20 ENCOUNTER — Other Ambulatory Visit: Payer: Self-pay | Admitting: Family Medicine

## 2022-11-20 DIAGNOSIS — Z1231 Encounter for screening mammogram for malignant neoplasm of breast: Secondary | ICD-10-CM

## 2022-12-08 ENCOUNTER — Ambulatory Visit (AMBULATORY_SURGERY_CENTER): Payer: Commercial Managed Care - HMO | Admitting: Gastroenterology

## 2022-12-08 ENCOUNTER — Encounter: Payer: Self-pay | Admitting: Gastroenterology

## 2022-12-08 VITALS — BP 105/74 | HR 63 | Temp 97.8°F | Resp 16 | Ht 65.0 in | Wt 145.0 lb

## 2022-12-08 DIAGNOSIS — Z1211 Encounter for screening for malignant neoplasm of colon: Secondary | ICD-10-CM | POA: Diagnosis not present

## 2022-12-08 DIAGNOSIS — D128 Benign neoplasm of rectum: Secondary | ICD-10-CM

## 2022-12-08 DIAGNOSIS — D122 Benign neoplasm of ascending colon: Secondary | ICD-10-CM

## 2022-12-08 DIAGNOSIS — D123 Benign neoplasm of transverse colon: Secondary | ICD-10-CM

## 2022-12-08 DIAGNOSIS — K621 Rectal polyp: Secondary | ICD-10-CM | POA: Diagnosis not present

## 2022-12-08 DIAGNOSIS — D124 Benign neoplasm of descending colon: Secondary | ICD-10-CM | POA: Diagnosis not present

## 2022-12-08 DIAGNOSIS — D125 Benign neoplasm of sigmoid colon: Secondary | ICD-10-CM | POA: Diagnosis not present

## 2022-12-08 DIAGNOSIS — K635 Polyp of colon: Secondary | ICD-10-CM | POA: Diagnosis not present

## 2022-12-08 MED ORDER — SODIUM CHLORIDE 0.9 % IV SOLN: 500.0000 mL | Freq: Once | INTRAVENOUS | Status: DC

## 2022-12-08 NOTE — Progress Notes (Signed)
Sedate, gd SR, tolerated procedure well, VSS, report to RN 

## 2022-12-08 NOTE — Op Note (Signed)
[facility] Patient Name: Linda Rodgers Procedure Date: 12/08/2022 8:32 AM MRN: [id number] Endoscopist: Ladene Artist , MD, [id number] Age: 52 Referring MD:  Date of Birth: 07-Mar-1971 Gender: Female Account #: [id number] Procedure:                Colonoscopy Indications:              Screening for colorectal malignant neoplasm Medicines:                Monitored Anesthesia Care Procedure:                Pre-Anesthesia Assessment:                           - Prior to the procedure, a History and Physical                            was performed, and patient medications and                            allergies were reviewed. The patient's tolerance of                            previous anesthesia was also reviewed. The risks                            and benefits of the procedure and the sedation                            options and risks were discussed with the patient.                            All questions were answered, and informed consent                            was obtained. Prior Anticoagulants: The patient has                            taken no anticoagulant or antiplatelet agents. ASA                            Grade Assessment: I - A normal, healthy patient.                            After reviewing the risks and benefits, the patient                            was deemed in satisfactory condition to undergo the                            procedure.                           After obtaining informed consent, the colonoscope  was passed under direct vision. Throughout the                            procedure, the patient's blood pressure, pulse, and                            oxygen saturations were monitored continuously. The                            Olympus [id number]) Colonoscope was                            introduced through the anus and advanced to the the                            cecum, identified by  appendiceal orifice and                            ileocecal valve. The ileocecal valve, appendiceal                            orifice, and rectum were photographed. The quality                            of the bowel preparation was good. The colonoscopy                            was somewhat difficult due to restricted mobility                            of the colon, significant looping and a tortuous                            colon. The patient tolerated the procedure well. Scope In: 8:35:55 AM Scope Out: 9:09:22 AM Scope Withdrawal Time: 0 hours 21 minutes 30 seconds  Total Procedure Duration: 0 hours 33 minutes 27 seconds  Findings:                 The perianal and digital rectal examinations were                            normal.                           Eleven sessile polyps were found in the rectum (3),                            sigmoid colon (1), descending colon (2), transverse                            colon (4) and ascending colon (1). The polyps were                            6 to 10 mm in size. These  polyps were removed with                            a cold snare. Resection and retrieval were complete.                           A few small-mouthed diverticula were found in the                            left colon.                           The exam was otherwise without abnormality on                            direct and retroflexion views. Complications:            No immediate complications. Estimated blood loss:                            None. Estimated Blood Loss:     Estimated blood loss: none. Impression:               - Eleven 6 to 10 mm polyps in the rectum, in the                            sigmoid colon, in the descending colon, in the                            transverse colon and in the ascending colon,                            removed with a cold snare. Resected and retrieved.                           - Mild diverticulosis in the left colon.                            - The examination was otherwise normal on direct                            and retroflexion views. Recommendation:           - Repeat colonoscopy after studies are complete for                            surveillance based on pathology results.                           - Patient has a contact number available for                            emergencies. The signs and symptoms of potential                            delayed complications  were discussed with the                            patient. Return to normal activities tomorrow.                            Written discharge instructions were provided to the                            patient.                           - Resume previous diet.                           - Continue present medications.                           - Await pathology results.                           - No aspirin, ibuprofen, naproxen, or other                            non-steroidal anti-inflammatory drugs for 2 weeks                            after polyp removal. Ladene Artist, MD 12/08/2022 9:17:33 AM This report has been signed electronically.

## 2022-12-08 NOTE — Patient Instructions (Signed)
Please read handouts provided. Continue present medications. Await pathology results. No aspirin, ibuprofen, naproxen, or other non-steriodal anti-inflammatory drugs for 2 weeks.   YOU HAD AN ENDOSCOPIC PROCEDURE TODAY AT THE [facility]:   Refer to the procedure report that was given to you for any specific questions about what was found during the examination.  If the procedure report does not answer your questions, please call your gastroenterologist to clarify.  If you requested that your care partner not be given the details of your procedure findings, then the procedure report has been included in a sealed envelope for you to review at your convenience later.  YOU SHOULD EXPECT: Some feelings of bloating in the abdomen. Passage of more gas than usual.  Walking can help get rid of the air that was put into your GI tract during the procedure and reduce the bloating. If you had a lower endoscopy (such as a colonoscopy or flexible sigmoidoscopy) you may notice spotting of blood in your stool or on the toilet paper. If you underwent a bowel prep for your procedure, you may not have a normal bowel movement for a few days.  Please Note:  You might notice some irritation and congestion in your nose or some drainage.  This is from the oxygen used during your procedure.  There is no need for concern and it should clear up in a day or so.  SYMPTOMS TO REPORT IMMEDIATELY:  Following lower endoscopy (colonoscopy or flexible sigmoidoscopy):  Excessive amounts of blood in the stool  Significant tenderness or worsening of abdominal pains  Swelling of the abdomen that is new, acute  Fever of 100F or higher  For urgent or emergent issues, a gastroenterologist can be reached at any hour by calling [phone]. Do not use MyChart messaging for urgent concerns.    DIET:  We do recommend a small meal at first, but then you may proceed to your regular diet.  Drink plenty of fluids but you  should avoid alcoholic beverages for 24 hours.  ACTIVITY:  You should plan to take it easy for the rest of today and you should NOT DRIVE or use heavy machinery until tomorrow (because of the sedation medicines used during the test).    FOLLOW UP: Our staff will call the number listed on your records the next business day following your procedure.  We will call around 7:15- 8:00 am to check on you and address any questions or concerns that you may have regarding the information given to you following your procedure. If we do not reach you, we will leave a message.     If any biopsies were taken you will be contacted by phone or by letter within the next 1-3 weeks.  Please call us at [phone] if you have not heard about the biopsies in 3 weeks.    SIGNATURES/CONFIDENTIALITY: You and/or your care partner have signed paperwork which will be entered into your electronic medical record.  These signatures attest to the fact that that the information above on your After Visit Summary has been reviewed and is understood.  Full responsibility of the confidentiality of this discharge information lies with you and/or your care-partner. 

## 2022-12-08 NOTE — Progress Notes (Signed)
History & Physical  Primary Care Physician:  Linda Sanders, MD Primary Gastroenterologist: Linda Edward, MD  Impression / Plan:  Average risk CRC screening for colonoscopy.  CHIEF COMPLAINT:  CRC screening  HPI: Linda Rodgers is a 52 y.o. female average risk CRC screening for colonoscopy.   Past Medical History:  Diagnosis Date   Allergy    Blood transfusion without reported diagnosis    at birth   Heart murmur    pregnancy    Past Surgical History:  Procedure Laterality Date   CESAREAN SECTION      breech twins   Linda Rodgers    Prior to Admission medications   Medication Sig Start Date End Date Taking? Authorizing Provider  Calcium-Magnesium-Vitamin D [id number] MG-MG-UNIT TB24 Take 1 tablet by mouth 2 (two) times daily. 08/20/15  Yes Linda Clack, MD  cycloSPORINE (RESTASIS) 0.05 % ophthalmic emulsion Place 1 drop into the right eye 2 (two) times daily. 03/02/22  Yes Dugal, Linda Bach, Linda Rodgers  MULTIPLE VITAMIN PO Take by mouth.   Yes [provider]  EPINEPHrine 0.3 mg/0.3 mL IJ SOAJ injection Inject 0.3 mg into the muscle as needed for anaphylaxis. 09/25/21   Linda Sanders, MD    Current Outpatient Medications  Medication Sig Dispense Refill   Calcium-Magnesium-Vitamin D [id number] MG-MG-UNIT TB24 Take 1 tablet by mouth 2 (two) times daily.     cycloSPORINE (RESTASIS) 0.05 % ophthalmic emulsion Place 1 drop into the right eye 2 (two) times daily. 0.4 mL 0   MULTIPLE VITAMIN PO Take by mouth.     EPINEPHrine 0.3 mg/0.3 mL IJ SOAJ injection Inject 0.3 mg into the muscle as needed for anaphylaxis. 1 each 0   Current Facility-Administered Medications  Medication Dose Route Frequency Provider Last Rate Last Admin   0.9 %  sodium chloride infusion  500 mL Intravenous Once Linda Artist, MD        Allergies as of 12/08/2022 - Review Complete 12/08/2022  Allergen Reaction Noted   Bee venom Anaphylaxis 11/11/2022   Neomycin   04/07/2011   Neosporin [neomycin-polymyxin b gu]  04/07/2011   Latex Rash 11/11/2022    Family History  Problem Relation Age of Onset   Hypertension Mother    Diabetes Father    Parkinsonism Father    Heart disease Father 13       MI x 2   Breast cancer Maternal Aunt 64   Diabetes Maternal Grandmother    Esophageal cancer Neg Hx    Colon cancer Neg Hx    Colon polyps Neg Hx    Rectal cancer Neg Hx    Stomach cancer Neg Hx     Social History   Socioeconomic History   Marital status: Married    Spouse name: Not on file   Number of children: Not on file   Years of education: Not on file   Highest education level: Not on file  Occupational History   Not on file  Tobacco Use   Smoking status: Never   Smokeless tobacco: Never  Substance and Sexual Activity   Alcohol use: No   Drug use: No   Sexual activity: Yes    Birth control/protection: Surgical  Other Topics Concern   Not on file  Social History Narrative    Married, 3 children (72, 20)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   Social Determinants of Health   Financial Resource Strain: Not on file  Food Insecurity: Not on file  Transportation Needs: Not on file  Physical Activity: Not on file  Stress: Not on file  Social Connections: Not on file  Intimate Partner Violence: Not on file    Review of Systems:  All systems reviewed were negative except where noted in HPI.   Physical Exam: General:  Alert, well-developed, in NAD Head:  Normocephalic and atraumatic. Eyes:  Sclera clear, no icterus.   Conjunctiva pink. Ears:  Normal auditory acuity. Mouth:  No deformity or lesions.  Neck:  Supple; no masses. Lungs:  Clear throughout to auscultation.   No wheezes, crackles, or rhonchi.  Heart:  Regular rate and rhythm; no murmurs. Abdomen:  Soft, nondistended, nontender. No masses, hepatomegaly. No palpable masses.  Normal bowel sounds.    Rectal:  Deferred   Msk:  Symmetrical without gross  deformities. Extremities:  Without edema. Neurologic:  Alert and  oriented x 4; grossly normal neurologically. Skin:  Intact without significant lesions or rashes. Psych:  Alert and cooperative. Normal mood and affect.   Linda Rodgers. Fuller Plan  12/08/2022, 8:26 AM See Linda Rodgers, [facility], to contact our on call provider

## 2022-12-08 NOTE — Progress Notes (Signed)
Pt's states no medical or surgical changes since previsit or office visit. 

## 2022-12-08 NOTE — Progress Notes (Signed)
Called to room to assist during endoscopic procedure.  Patient ID and intended procedure confirmed with present staff. Received instructions for my participation in the procedure from the performing physician.  

## 2022-12-09 ENCOUNTER — Telehealth: Payer: Self-pay

## 2022-12-09 NOTE — Telephone Encounter (Signed)
  Follow up Call-     12/08/2022    7:49 AM  Call back number  Post procedure Call Back phone  # [phone]  Permission to leave phone message Yes     Patient questions:  Do you have a fever, pain , or abdominal swelling? No. Pain Score  0 *  Have you tolerated food without any problems? Yes.    Have you been able to return to your normal activities? Yes.    Do you have any questions about your discharge instructions: Diet   No. Medications  No. Follow up visit  No.  Do you have questions or concerns about your Care? No.  Actions: * If pain score is 4 or above: No action needed, pain <4.

## 2022-12-17 ENCOUNTER — Encounter: Payer: Self-pay | Admitting: Gastroenterology

## 2023-01-21 ENCOUNTER — Ambulatory Visit
Admission: RE | Admit: 2023-01-21 | Discharge: 2023-01-21 | Disposition: A | Discharge status: Home / Self Care | Payer: Commercial Managed Care - HMO | Source: Ambulatory Visit | Attending: Family Medicine | Admitting: Family Medicine

## 2023-01-21 DIAGNOSIS — Z1231 Encounter for screening mammogram for malignant neoplasm of breast: Secondary | ICD-10-CM

## 2023-06-08 ENCOUNTER — Ambulatory Visit (INDEPENDENT_AMBULATORY_CARE_PROVIDER_SITE_OTHER): Payer: Managed Care, Other (non HMO)

## 2023-06-08 DIAGNOSIS — Z23 Encounter for immunization: Secondary | ICD-10-CM | POA: Diagnosis not present

## 2023-09-13 ENCOUNTER — Telehealth: Payer: Self-pay | Admitting: *Deleted

## 2023-09-13 DIAGNOSIS — Z13220 Encounter for screening for lipoid disorders: Secondary | ICD-10-CM

## 2023-09-13 NOTE — Telephone Encounter (Signed)
-----   Message from Lovena Neighbours sent at 09/13/2023  3:13 PM EST ----- Regarding: Labs for Tuesday 1.21.25 Please put physical lab orders in future. Thank you, Denny Peon

## 2023-09-28 ENCOUNTER — Other Ambulatory Visit (INDEPENDENT_AMBULATORY_CARE_PROVIDER_SITE_OTHER): Payer: Commercial Managed Care - HMO

## 2023-09-28 ENCOUNTER — Encounter: Payer: Self-pay | Admitting: Family Medicine

## 2023-09-28 DIAGNOSIS — Z13220 Encounter for screening for lipoid disorders: Secondary | ICD-10-CM | POA: Diagnosis not present

## 2023-09-28 LAB — COMPREHENSIVE METABOLIC PANEL
ALT: 23 U/L (ref 0–35)
AST: 19 U/L (ref 0–37)
Albumin: 4.8 g/dL (ref 3.5–5.2)
Alkaline Phosphatase: 60 U/L (ref 39–117)
BUN: 14 mg/dL (ref 6–23)
CO2: 32 meq/L (ref 19–32)
Calcium: 10 mg/dL (ref 8.4–10.5)
Chloride: 100 meq/L (ref 96–112)
Creatinine, Ser: 0.78 mg/dL (ref 0.40–1.20)
GFR: 87.83 mL/min (ref >60.00)
Glucose, Bld: 99 mg/dL (ref 70–99)
Potassium: 4.2 meq/L (ref 3.5–5.1)
Sodium: 140 meq/L (ref 135–145)
Total Bilirubin: 0.8 mg/dL (ref 0.2–1.2)
Total Protein: 7.2 g/dL (ref 6.0–8.3)

## 2023-09-28 LAB — LIPID PANEL
Cholesterol: 197 mg/dL (ref 0–200)
HDL: 69.6 mg/dL (ref >39.00)
LDL Cholesterol: 115 mg/dL — ABNORMAL HIGH (ref 0–99)
NonHDL: 127.1
Total CHOL/HDL Ratio: 3
Triglycerides: 59 mg/dL (ref 0.0–149.0)
VLDL: 11.8 mg/dL (ref 0.0–40.0)

## 2023-09-28 NOTE — Progress Notes (Signed)
No critical labs need to be addressed urgently. We will discuss labs in detail at upcoming office visit.   

## 2023-10-05 ENCOUNTER — Encounter: Payer: Self-pay | Admitting: Family Medicine

## 2023-10-05 ENCOUNTER — Ambulatory Visit (INDEPENDENT_AMBULATORY_CARE_PROVIDER_SITE_OTHER): Payer: Commercial Managed Care - HMO | Admitting: Family Medicine

## 2023-10-05 VITALS — BP 100/70 | HR 83 | Temp 98.5°F | Ht 65.25 in | Wt 146.2 lb

## 2023-10-05 DIAGNOSIS — Z0000 Encounter for general adult medical examination without abnormal findings: Secondary | ICD-10-CM

## 2023-10-05 NOTE — Progress Notes (Signed)
Patient ID: Linda Rodgers, female    DOB: October 22, 1970, 53 y.o.   MRN: [id number]  This visit was conducted in person.  BP 100/70 (BP Location: Left Arm, Patient Position: Sitting, Cuff Size: Normal)   Pulse 83   Temp 98.5 F (36.9 C) (Temporal)   Ht 5' 5.25" (1.657 m)   Wt 146 lb 4 oz (66.3 kg)   SpO2 99%   BMI 24.15 kg/m    CC:  Chief Complaint  Patient presents with   Annual Exam    Subjective:   HPI: Linda Rodgers is a 53 y.o. female presenting on 10/05/2023 for Annual Exam  The patient presents for complete physical and review of chronic health problems. She also has the following acute concerns today: none  Reviewed labs and tail with patient..  Diet: heart healthy diet Exercise: 5-6 days week.  Body mass index is 24.15 kg/m.  Lab Results  Component Value Date   CHOL 197 09/28/2023   HDL 69.60 09/28/2023   LDLCALC 115 (H) 09/28/2023   TRIG 59.0 09/28/2023   CHOLHDL 3 09/28/2023   The 10-year ASCVD risk score (Arnett DK, et al., 2019) is: 0.6%   Values used to calculate the score:     Age: 38 years     Sex: Female     Is Non-Hispanic African American: No     Diabetic: No     Tobacco smoker: No     Systolic Blood Pressure: 100 mmHg     Is BP treated: No     HDL Cholesterol: 69.6 mg/dL     Total Cholesterol: 197 mg/dL        Relevant past medical, surgical, family and social history reviewed and updated as indicated. Interim medical history since our last visit reviewed. Allergies and medications reviewed and updated. Outpatient Medications Prior to Visit  Medication Sig Dispense Refill   Calcium-Magnesium-Vitamin D 600-40-500 MG-MG-UNIT TB24 Take 1 tablet by mouth 2 (two) times daily.     cycloSPORINE (RESTASIS) 0.05 % ophthalmic emulsion Place 1 drop into the right eye 2 (two) times daily. 0.4 mL 0   EPINEPHrine 0.3 mg/0.3 mL IJ SOAJ injection Inject 0.3 mg into the muscle as needed for anaphylaxis. 1 each 0   MULTIPLE VITAMIN PO Take by  mouth.     No facility-administered medications prior to visit.     Per HPI unless specifically indicated in ROS section below Review of Systems  Constitutional:  Negative for fatigue and fever.  HENT:  Negative for congestion.   Eyes:  Negative for pain.  Respiratory:  Negative for cough and shortness of breath.   Cardiovascular:  Negative for chest pain, palpitations and leg swelling.  Gastrointestinal:  Negative for abdominal pain.  Genitourinary:  Negative for dysuria and vaginal bleeding.  Musculoskeletal:  Negative for back pain.  Neurological:  Negative for syncope, light-headedness and headaches.  Psychiatric/Behavioral:  Negative for dysphoric mood.     Objective:  BP 100/70 (BP Location: Left Arm, Patient Position: Sitting, Cuff Size: Normal)   Pulse 83   Temp 98.5 F (36.9 C) (Temporal)   Ht 5' 5.25" (1.657 m)   Wt 146 lb 4 oz (66.3 kg)   SpO2 99%   BMI 24.15 kg/m   Wt Readings from Last 3 Encounters:  10/05/23 146 lb 4 oz (66.3 kg)  12/08/22 145 lb (65.8 kg)  11/11/22 145 lb (65.8 kg)      Physical Exam Vitals and nursing note reviewed.  Constitutional:  General: She is not in acute distress.    Appearance: Normal appearance. She is well-developed. She is not ill-appearing or toxic-appearing.  HENT:     Head: Normocephalic.     Right Ear: Hearing, tympanic membrane, ear canal and external ear normal.     Left Ear: Hearing, tympanic membrane, ear canal and external ear normal.     Nose: Nose normal.  Eyes:     General: Lids are normal. Lids are everted, no foreign bodies appreciated.     Conjunctiva/sclera: Conjunctivae normal.     Pupils: Pupils are equal, round, and reactive to light.  Neck:     Thyroid: No thyroid mass or thyromegaly.     Vascular: No carotid bruit.     Trachea: Trachea normal.  Cardiovascular:     Rate and Rhythm: Normal rate and regular rhythm.     Heart sounds: Normal heart sounds, S1 normal and S2 normal. No murmur heard.     No gallop.  Pulmonary:     Effort: Pulmonary effort is normal. No respiratory distress.     Breath sounds: Normal breath sounds. No wheezing, rhonchi or rales.  Chest:  Breasts:    Left: Normal.  Abdominal:     General: Bowel sounds are normal. There is no distension or abdominal bruit.     Palpations: Abdomen is soft. There is no fluid wave or mass.     Tenderness: There is no abdominal tenderness. There is no guarding or rebound.     Hernia: No hernia is present.  Genitourinary:    Exam position: Supine.     Labia:        Right: No rash, tenderness or lesion.        Left: No rash, tenderness or lesion.      Vagina: Normal.     Cervix: No cervical motion tenderness, discharge or friability.     Uterus: Not enlarged and not tender.      Adnexa:        Right: No mass, tenderness or fullness.         Left: No mass, tenderness or fullness.    Musculoskeletal:     Cervical back: Normal range of motion and neck supple.  Lymphadenopathy:     Cervical: No cervical adenopathy.  Skin:    General: Skin is warm and dry.     Findings: No rash.  Neurological:     Mental Status: She is alert.     Cranial Nerves: No cranial nerve deficit.     Sensory: No sensory deficit.  Psychiatric:        Mood and Affect: Mood is not anxious or depressed.        Speech: Speech normal.        Behavior: Behavior normal. Behavior is cooperative.        Judgment: Judgment normal.       Results for orders placed or performed in visit on 09/28/23  Lipid panel   Collection Time: 09/28/23  7:30 AM  Result Value Ref Range   Cholesterol 197 0 - 200 mg/dL   Triglycerides 57.8 0.0 - 149.0 mg/dL   HDL 46.96 >29.52 mg/dL   VLDL 84.1 0.0 - 32.4 mg/dL   LDL Cholesterol 401 (H) 0 - 99 mg/dL   Total CHOL/HDL Ratio 3    NonHDL 127.10   Comprehensive metabolic panel   Collection Time: 09/28/23  7:30 AM  Result Value Ref Range   Sodium 140 135 - 145 mEq/L  Potassium 4.2 3.5 - 5.1 mEq/L   Chloride 100 96 -  112 mEq/L   CO2 32 19 - 32 mEq/L   Glucose, Bld 99 70 - 99 mg/dL   BUN 14 6 - 23 mg/dL   Creatinine, Ser 1.61 0.40 - 1.20 mg/dL   Total Bilirubin 0.8 0.2 - 1.2 mg/dL   Alkaline Phosphatase 60 39 - 117 U/L   AST 19 0 - 37 U/L   ALT 23 0 - 35 U/L   Total Protein 7.2 6.0 - 8.3 g/dL   Albumin 4.8 3.5 - 5.2 g/dL   GFR 09.60 >45.40 mL/min   Calcium 10.0 8.4 - 10.5 mg/dL    Assessment and Plan The patient's preventative maintenance and recommended screening tests for an annual wellness exam were reviewed in full today. Brought up to date unless services declined.  Counselled on the importance of diet, exercise, and its role in overall health and mortality. The patient's FH and SH was reviewed, including their home life, tobacco status, and drug and alcohol status.   Vaccines: Uptodate, flu vaccine 05/2022,  and td given, consider shingles vaccine. Pap/DVE:  negative 09/2022, repeat in 2029 Mammo:  normal 01/2023 aunt with breast cancer. Bone Density:  Not indicated Colon: no early family history.. 12/2022: colonoscopy.. 11 polyps.. repeat in 3 years.  Smoking Status: nonsmoker ETOH/ drug use: none/ none  HIV screen:  refused, neg in past Hep C: done   Routine general medical examination at a health care facility      No follow-ups on file.   Kerby Nora, MD

## 2023-11-13 IMAGING — MG MM DIGITAL SCREENING BILAT W/ TOMO AND CAD
8 series · 8 of 24 positions shown · non-contrast
Comparison: Previous exam(s).

CLINICAL DATA: Screening.

EXAM:
DIGITAL SCREENING BILATERAL MAMMOGRAM WITH TOMOSYNTHESIS AND CAD
TECHNIQUE: Bilateral screening digital craniocaudal and mediolateral oblique
mammograms were obtained. Bilateral screening digital breast
tomosynthesis was performed. The images were evaluated with
computer-aided detection.

[R CC synth-2D]
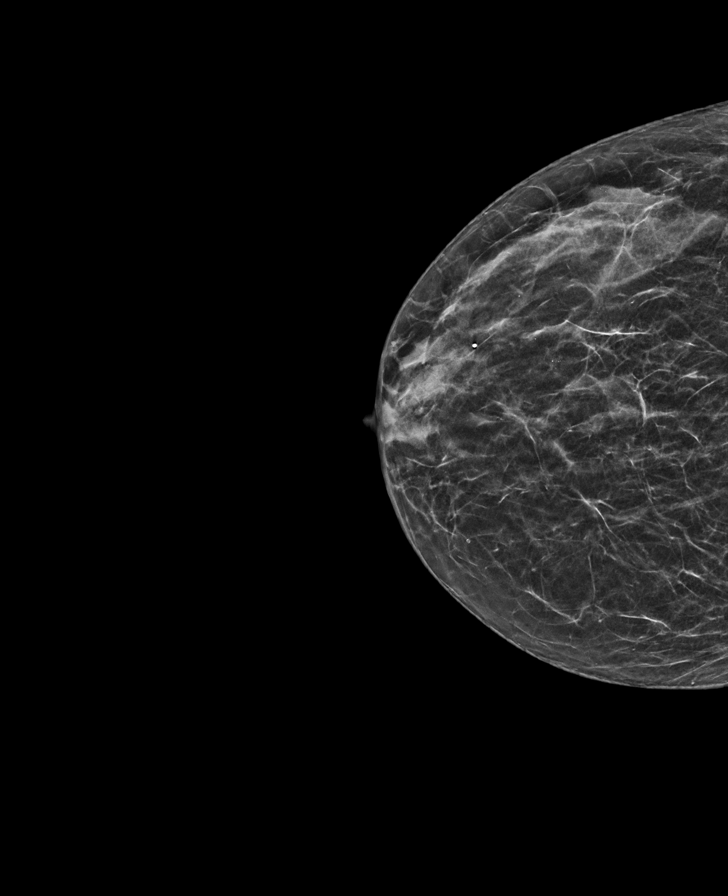

[L MLO synth-2D]
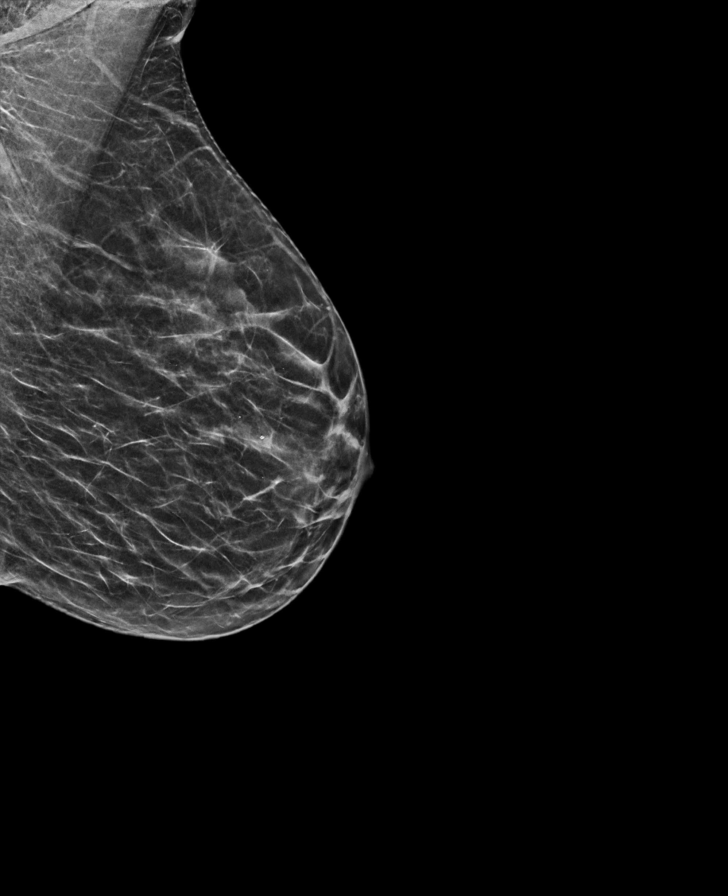

[L CC synth-2D]
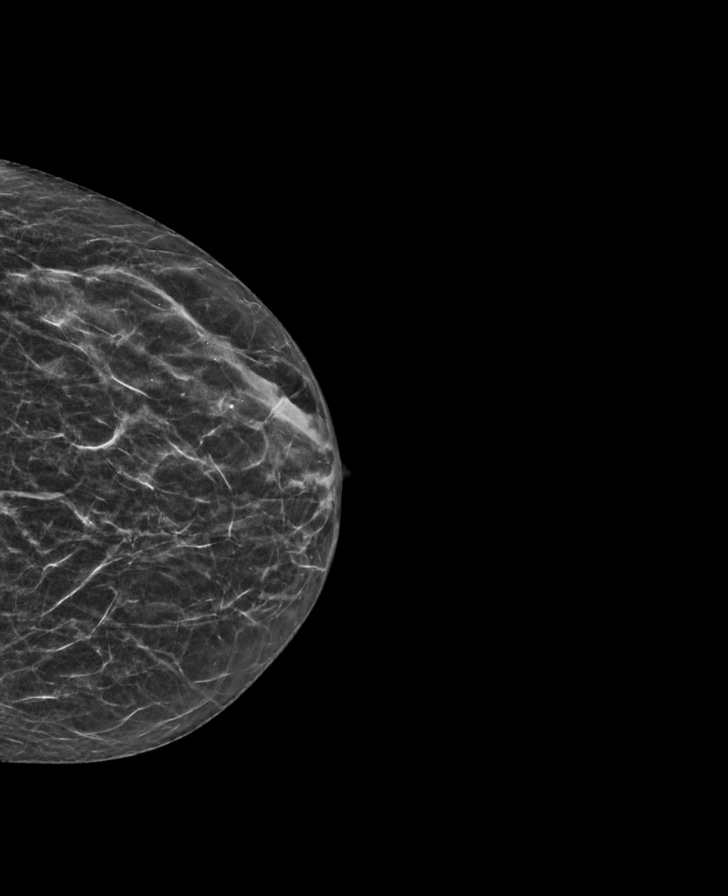

[R MLO synth-2D]
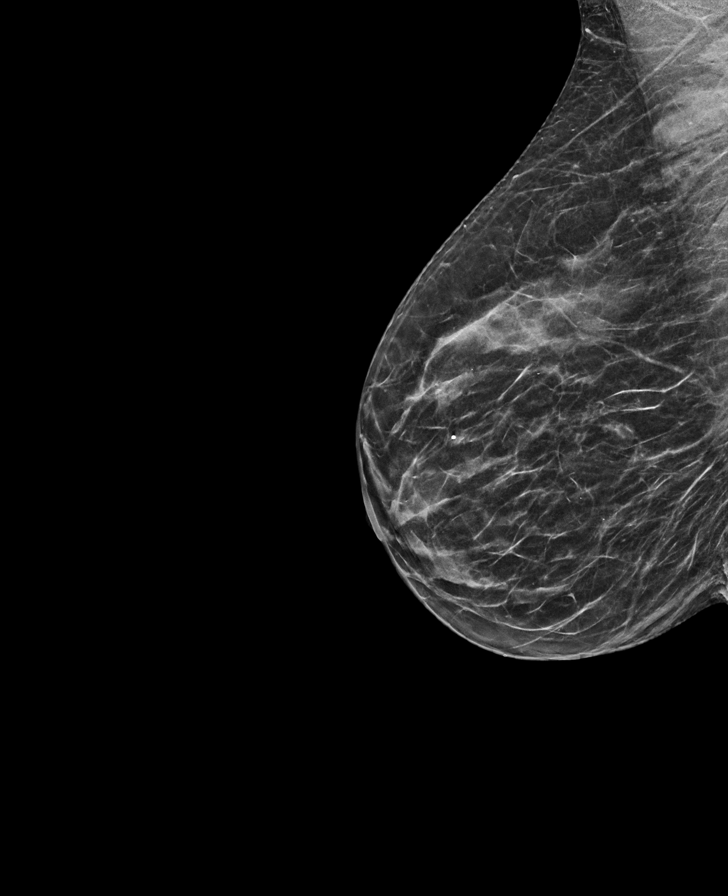

[R CC tomo · tomo slice 23/45.0]
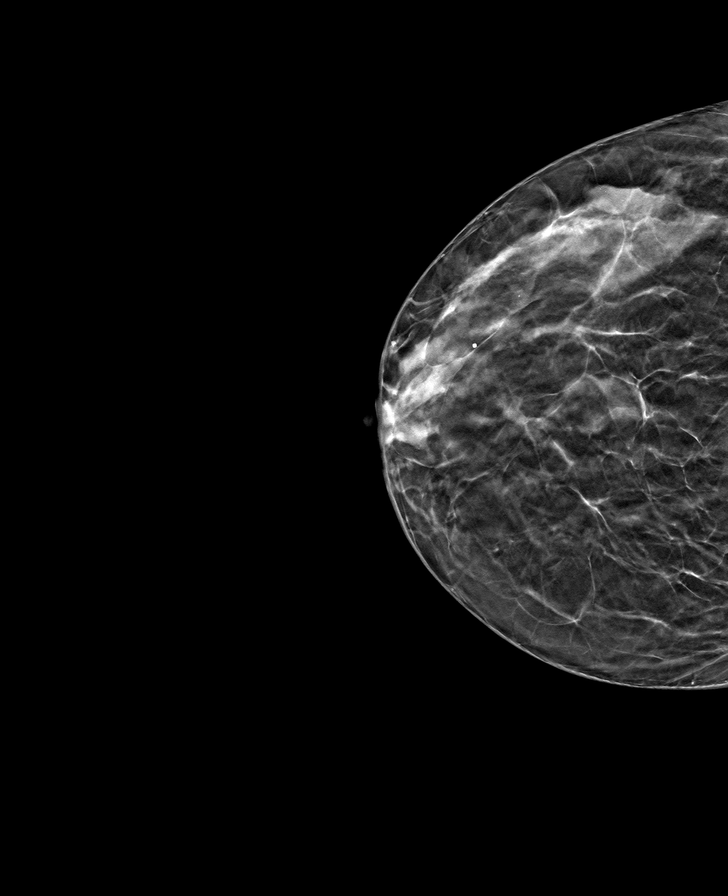

[L CC tomo · tomo slice 23/44.0]
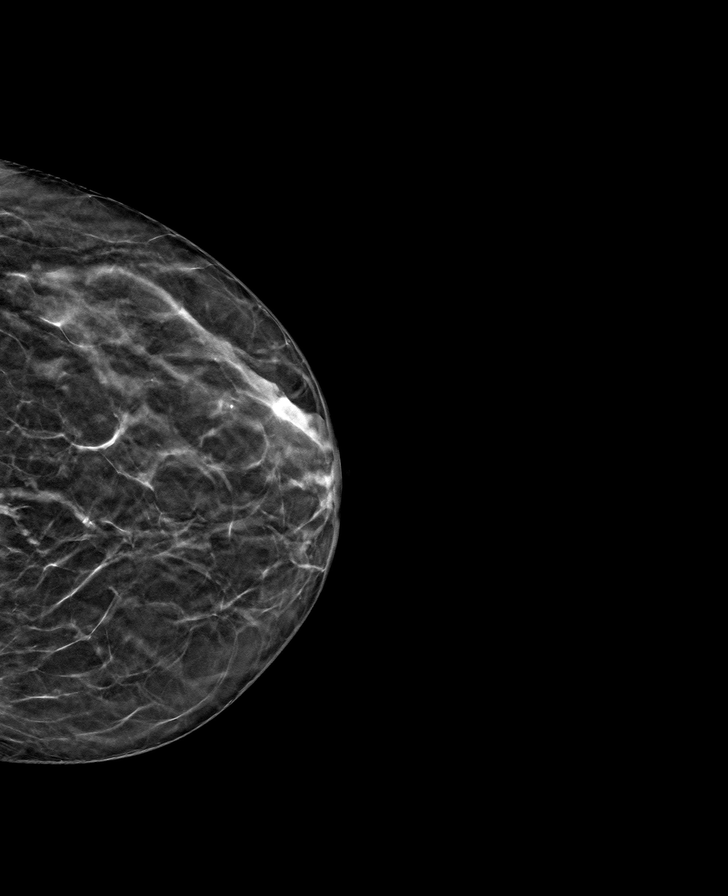

[R MLO tomo · tomo slice 25/50.0]
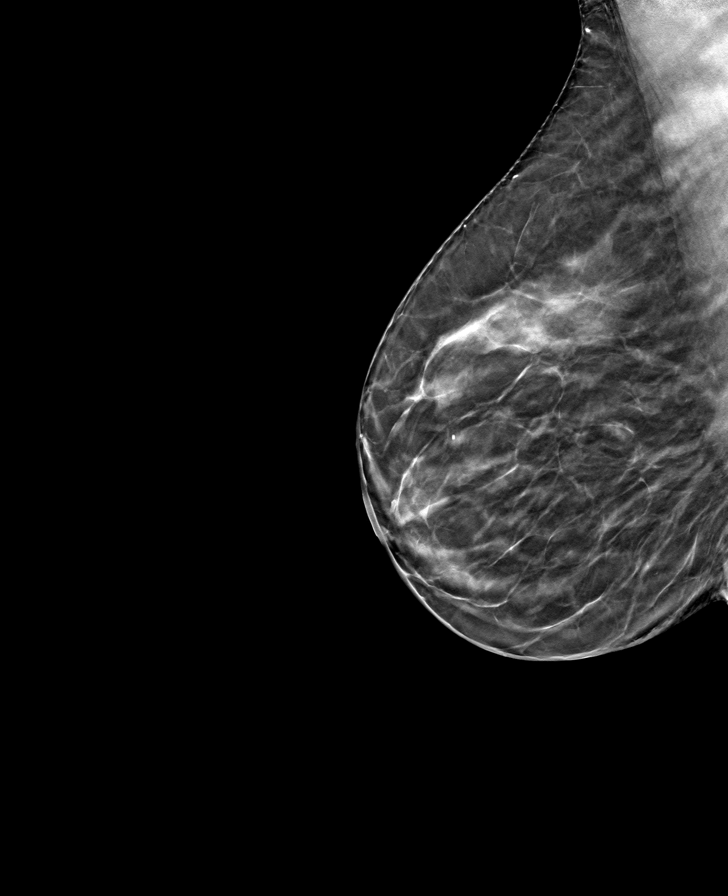

[L MLO tomo · tomo slice 26/51.0]
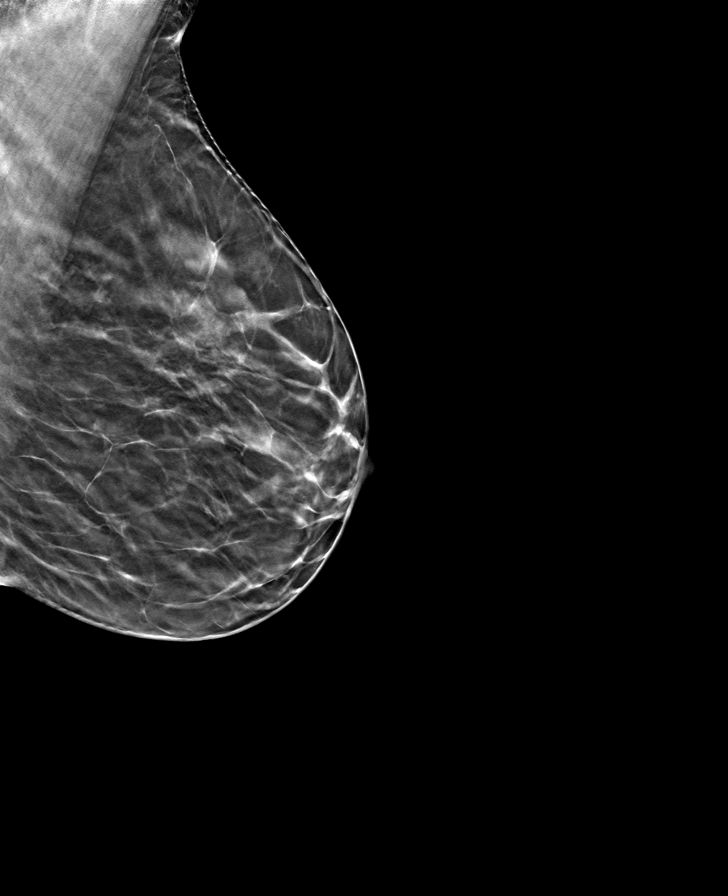

[8 of 24 positions shown; findings below may reference images not displayed]

ACR Breast Density Category b: There are scattered areas of
fibroglandular density.
FINDINGS: There are no findings suspicious for malignancy.
IMPRESSION: No mammographic evidence of malignancy. A result letter of this
screening mammogram will be mailed directly to the patient.

RECOMMENDATION:
Screening mammogram in one year. (Code:51-O-LD2)

BI-RADS CATEGORY  1: Negative.

## 2023-11-16 ENCOUNTER — Other Ambulatory Visit: Payer: Self-pay | Admitting: Family Medicine

## 2023-11-16 DIAGNOSIS — Z1231 Encounter for screening mammogram for malignant neoplasm of breast: Secondary | ICD-10-CM

## 2024-01-25 ENCOUNTER — Ambulatory Visit
Admission: RE | Admit: 2024-01-25 | Discharge: 2024-01-25 | Disposition: A | Discharge status: Home / Self Care | Source: Ambulatory Visit | Attending: Family Medicine | Admitting: Family Medicine

## 2024-01-25 DIAGNOSIS — Z1231 Encounter for screening mammogram for malignant neoplasm of breast: Secondary | ICD-10-CM

## 2024-01-28 ENCOUNTER — Ambulatory Visit: Payer: Self-pay | Admitting: Family Medicine

## 2024-05-31 ENCOUNTER — Ambulatory Visit (INDEPENDENT_AMBULATORY_CARE_PROVIDER_SITE_OTHER)

## 2024-05-31 DIAGNOSIS — Z23 Encounter for immunization: Secondary | ICD-10-CM

## 2024-05-31 NOTE — Progress Notes (Signed)
 Per orders of Dr. Greig Ring, injection of regular flu shot given by Laray Arenas in right deltoid. Patient tolerated injection well.

## 2024-09-14 ENCOUNTER — Telehealth: Payer: Self-pay | Admitting: *Deleted

## 2024-09-14 DIAGNOSIS — Z13220 Encounter for screening for lipoid disorders: Secondary | ICD-10-CM

## 2024-09-14 NOTE — Telephone Encounter (Signed)
-----   Message from Veva JINNY Ferrari sent at 09/14/2024  3:33 PM EST ----- Regarding: Lab orders for [facility], 1.22.26 Patient is scheduled for CPX labs, please order future labs, Thanks , Veva

## 2024-09-28 ENCOUNTER — Other Ambulatory Visit (INDEPENDENT_AMBULATORY_CARE_PROVIDER_SITE_OTHER): Payer: Commercial Managed Care - HMO

## 2024-09-28 ENCOUNTER — Ambulatory Visit: Payer: Self-pay | Admitting: Family Medicine

## 2024-09-28 DIAGNOSIS — Z13220 Encounter for screening for lipoid disorders: Secondary | ICD-10-CM | POA: Diagnosis not present

## 2024-09-28 LAB — COMPREHENSIVE METABOLIC PANEL WITH GFR
ALT: 22 U/L (ref 3–35)
AST: 17 U/L (ref 5–37)
Albumin: 4.5 g/dL (ref 3.5–5.2)
Alkaline Phosphatase: 61 U/L (ref 39–117)
BUN: 15 mg/dL (ref 6–23)
CO2: 32 meq/L (ref 19–32)
Calcium: 9.6 mg/dL (ref 8.4–10.5)
Chloride: 101 meq/L (ref 96–112)
Creatinine, Ser: 0.78 mg/dL (ref 0.40–1.20)
GFR: 87.21 mL/min
Glucose, Bld: 90 mg/dL (ref 70–99)
Potassium: 4 meq/L (ref 3.5–5.1)
Sodium: 138 meq/L (ref 135–145)
Total Bilirubin: 0.7 mg/dL (ref 0.2–1.2)
Total Protein: 6.9 g/dL (ref 6.0–8.3)

## 2024-09-28 LAB — LIPID PANEL
Cholesterol: 190 mg/dL (ref 28–200)
HDL: 70.8 mg/dL
LDL Cholesterol: 111 mg/dL — ABNORMAL HIGH (ref 10–99)
NonHDL: 119.38
Total CHOL/HDL Ratio: 3
Triglycerides: 44 mg/dL (ref 10.0–149.0)
VLDL: 8.8 mg/dL (ref 0.0–40.0)

## 2024-09-28 NOTE — Progress Notes (Signed)
 No critical labs need to be addressed urgently. We will discuss labs in detail at upcoming office visit.

## 2024-10-05 ENCOUNTER — Ambulatory Visit: Payer: Commercial Managed Care - HMO | Admitting: Family Medicine

## 2024-10-05 ENCOUNTER — Encounter: Payer: Self-pay | Admitting: Family Medicine

## 2024-10-05 VITALS — BP 96/70 | HR 77 | Temp 98.3°F | Ht 65.0 in | Wt 150.2 lb

## 2024-10-05 DIAGNOSIS — Z0000 Encounter for general adult medical examination without abnormal findings: Secondary | ICD-10-CM

## 2024-10-05 DIAGNOSIS — Z8249 Family history of ischemic heart disease and other diseases of the circulatory system: Secondary | ICD-10-CM | POA: Diagnosis not present

## 2024-10-05 NOTE — Progress Notes (Signed)
 "   Patient ID: Linda Rodgers, female    DOB: 03/24/1971, 54 y.o.   MRN: [id number]  This visit was conducted in person.  BP 96/70   Pulse 77   Temp 98.3 F (36.8 C) (Temporal)   Ht 5' 5 (1.651 m)   Wt 150 lb 4 oz (68.2 kg)   LMP 09/01/2021   SpO2 99%   BMI 25.00 kg/m    CC:  Chief Complaint  Patient presents with   Annual Exam    Subjective:   HPI: Linda Rodgers is a 54 y.o. female presenting on 10/05/2024 for Annual Exam  The patient presents for complete physical and review of chronic health problems. She also has the following acute concerns today: none  Reviewed labs and tail with patient..  Diet: heart healthy diet Exercise: 5-6 days week.  Body mass index is 25 kg/m.  Father with CAD early age. Lab Results  Component Value Date   CHOL 190 09/28/2024   HDL 70.80 09/28/2024   LDLCALC 111 (H) 09/28/2024   TRIG 44.0 09/28/2024   CHOLHDL 3 09/28/2024   The 10-year ASCVD risk score (Arnett DK, et al., 2019) is: 0.6%   Values used to calculate the score:     Age: 7 years     Clinically relevant sex: Female     Is Non-Hispanic African American: No     Diabetic: No     Tobacco smoker: No     Systolic Blood Pressure: 96 mmHg     Is BP treated: No     HDL Cholesterol: 70.8 mg/dL     Total Cholesterol: 190 mg/dL        Relevant past medical, surgical, family and social history reviewed and updated as indicated. Interim medical history since our last visit reviewed. Allergies and medications reviewed and updated. Outpatient Medications Prior to Visit  Medication Sig Dispense Refill   Calcium -Magnesium -Vitamin D  600-40-500 MG-MG-UNIT TB24 Take 1 tablet by mouth 2 (two) times daily.     cycloSPORINE  (RESTASIS ) 0.05 % ophthalmic emulsion Place 1 drop into the right eye 2 (two) times daily. 0.4 mL 0   EPINEPHrine  0.3 mg/0.3 mL IJ SOAJ injection Inject 0.3 mg into the muscle as needed for anaphylaxis. 1 each 0   MULTIPLE VITAMIN PO Take by mouth.      No facility-administered medications prior to visit.     Per HPI unless specifically indicated in ROS section below Review of Systems  Constitutional:  Negative for fatigue and fever.  HENT:  Negative for congestion.   Eyes:  Negative for pain.  Respiratory:  Negative for cough and shortness of breath.   Cardiovascular:  Negative for chest pain, palpitations and leg swelling.  Gastrointestinal:  Negative for abdominal pain.  Genitourinary:  Negative for dysuria and vaginal bleeding.  Musculoskeletal:  Negative for back pain.  Neurological:  Negative for syncope, light-headedness and headaches.  Psychiatric/Behavioral:  Negative for dysphoric mood.     Objective:  BP 96/70   Pulse 77   Temp 98.3 F (36.8 C) (Temporal)   Ht 5' 5 (1.651 m)   Wt 150 lb 4 oz (68.2 kg)   LMP 09/01/2021   SpO2 99%   BMI 25.00 kg/m   Wt Readings from Last 3 Encounters:  10/05/24 150 lb 4 oz (68.2 kg)  10/05/23 146 lb 4 oz (66.3 kg)  12/08/22 145 lb (65.8 kg)      Physical Exam Vitals and nursing note reviewed.  Constitutional:  General: She is not in acute distress.    Appearance: Normal appearance. She is well-developed. She is not ill-appearing or toxic-appearing.  HENT:     Head: Normocephalic.     Right Ear: Hearing, tympanic membrane, ear canal and external ear normal.     Left Ear: Hearing, tympanic membrane, ear canal and external ear normal.     Nose: Nose normal.  Eyes:     General: Lids are normal. Lids are everted, no foreign bodies appreciated.     Conjunctiva/sclera: Conjunctivae normal.     Pupils: Pupils are equal, round, and reactive to light.  Neck:     Thyroid : No thyroid  mass or thyromegaly.     Vascular: No carotid bruit.     Trachea: Trachea normal.  Cardiovascular:     Rate and Rhythm: Normal rate and regular rhythm.     Heart sounds: Normal heart sounds, S1 normal and S2 normal. No murmur heard.    No gallop.  Pulmonary:     Effort: Pulmonary effort is  normal. No respiratory distress.     Breath sounds: Normal breath sounds. No wheezing, rhonchi or rales.  Chest:  Breasts:    Left: Normal.  Abdominal:     General: Bowel sounds are normal. There is no distension or abdominal bruit.     Palpations: Abdomen is soft. There is no fluid wave or mass.     Tenderness: There is no abdominal tenderness. There is no guarding or rebound.     Hernia: No hernia is present.  Genitourinary:    Exam position: Supine.     Labia:        Right: No rash, tenderness or lesion.        Left: No rash, tenderness or lesion.      Vagina: Normal.     Cervix: No cervical motion tenderness, discharge or friability.     Uterus: Not enlarged and not tender.      Adnexa:        Right: No mass, tenderness or fullness.         Left: No mass, tenderness or fullness.    Musculoskeletal:     Cervical back: Normal range of motion and neck supple.  Lymphadenopathy:     Cervical: No cervical adenopathy.  Skin:    General: Skin is warm and dry.     Findings: No rash.  Neurological:     Mental Status: She is alert.     Cranial Nerves: No cranial nerve deficit.     Sensory: No sensory deficit.  Psychiatric:        Mood and Affect: Mood is not anxious or depressed.        Speech: Speech normal.        Behavior: Behavior normal. Behavior is cooperative.        Judgment: Judgment normal.       Results for orders placed or performed in visit on 09/28/24  Lipid panel   Collection Time: 09/28/24  7:30 AM  Result Value Ref Range   Cholesterol 190 28 - 200 mg/dL   Triglycerides 55.9 89.9 - 149.0 mg/dL   HDL 29.19 >60.99 mg/dL   VLDL 8.8 0.0 - 59.9 mg/dL   LDL Cholesterol 888 (H) 10 - 99 mg/dL   Total CHOL/HDL Ratio 3    NonHDL 119.38   Comprehensive metabolic panel   Collection Time: 09/28/24  7:30 AM  Result Value Ref Range   Sodium 138 135 - 145 mEq/L  Potassium 4.0 3.5 - 5.1 mEq/L   Chloride 101 96 - 112 mEq/L   CO2 32 19 - 32 mEq/L   Glucose, Bld 90 70  - 99 mg/dL   BUN 15 6 - 23 mg/dL   Creatinine, Ser 9.21 0.40 - 1.20 mg/dL   Total Bilirubin 0.7 0.2 - 1.2 mg/dL   Alkaline Phosphatase 61 39 - 117 U/L   AST 17 5 - 37 U/L   ALT 22 3 - 35 U/L   Total Protein 6.9 6.0 - 8.3 g/dL   Albumin 4.5 3.5 - 5.2 g/dL   GFR 12.78 >39.99 mL/min   Calcium  9.6 8.4 - 10.5 mg/dL    Assessment and Plan The patient's preventative maintenance and recommended screening tests for an annual wellness exam were reviewed in full today. Brought up to date unless services declined.  Counselled on the importance of diet, exercise, and its role in overall health and mortality. The patient's FH and SH was reviewed, including their home life, tobacco status, and drug and alcohol status.   Vaccines: Uptodate, flu vaccine 05/2022,  and td given, consider  prenvar, -shingles vaccine. Pap/DVE:  negative 09/2022, repeat in 2029 Mammo:  normal 01/2024 aunt with breast cancer. Bone Density:  Not indicated Colon: no early family history.. 12/2022: colonoscopy.. 11 polyps.. repeat in 3 years.  Smoking Status: nonsmoker ETOH/ drug use: none/ none  HIV screen:  refused, neg in past Hep C: done   Routine general medical examination at a health care facility  Family history of coronary artery disease in father Assessment & Plan:   Will eval with lipoprotein a.. consider coronary artery CT score.   Orders: -     Lipoprotein A (LPA); Future       Return for  fasting lab only.   Greig Ring, MD  "

## 2024-10-05 NOTE — Assessment & Plan Note (Signed)
"    Will eval with lipoprotein a.. consider coronary artery CT score.  "

## 2024-10-13 ENCOUNTER — Telehealth: Payer: Self-pay | Admitting: *Deleted

## 2024-10-13 ENCOUNTER — Other Ambulatory Visit

## 2024-10-13 NOTE — Telephone Encounter (Signed)
 Copied from CRM [id number]. Topic: Clinical - Request for Lab/Test Order >> Oct 13, 2024 11:07 AM Treva T wrote: Reason for CRM: Pt calling to inquire when lab orders for next scheduled physical visit will be entered so that pt can go have labs drawn 1 week prior to appt on  10/11/25.  Pt requesting a follow up call to advise further.   Ph. [phone]

## 2024-10-13 NOTE — Telephone Encounter (Signed)
 Please call and schedule lab appointment prior to her next year physical.

## 2024-10-13 NOTE — Telephone Encounter (Signed)
 Called and schedule pt for  labs for 2027

## 2024-10-19 ENCOUNTER — Other Ambulatory Visit

## 2025-10-04 ENCOUNTER — Other Ambulatory Visit

## 2025-10-11 ENCOUNTER — Encounter: Admitting: Family Medicine
